# Patient Record
Sex: Female | Born: 1980 | Race: Black or African American | Hispanic: No | Marital: Single
Health system: Midwestern US, Community
[De-identification: ages and names within clinical notes are randomized; demographics above are authoritative.]

## PROBLEM LIST (undated history)

## (undated) DIAGNOSIS — J45909 Unspecified asthma, uncomplicated: Secondary | ICD-10-CM

## (undated) DIAGNOSIS — J45901 Unspecified asthma with (acute) exacerbation: Principal | ICD-10-CM

---

## 1998-03-11 ENCOUNTER — Emergency Department (HOSPITAL_COMMUNITY): Admission: EM | Admit: 1998-03-11 | Discharge: 1998-03-11 | Payer: Self-pay | Admitting: Emergency Medicine

## 1998-10-31 ENCOUNTER — Emergency Department (HOSPITAL_COMMUNITY): Admission: EM | Admit: 1998-10-31 | Discharge: 1998-10-31 | Payer: Self-pay | Admitting: Emergency Medicine

## 1998-11-01 ENCOUNTER — Encounter: Payer: Self-pay | Admitting: Emergency Medicine

## 1999-07-21 ENCOUNTER — Encounter: Payer: Self-pay | Admitting: Emergency Medicine

## 1999-07-21 ENCOUNTER — Emergency Department (HOSPITAL_COMMUNITY): Admission: EM | Admit: 1999-07-21 | Discharge: 1999-07-21 | Payer: Self-pay | Admitting: Emergency Medicine

## 1999-07-31 ENCOUNTER — Inpatient Hospital Stay (HOSPITAL_COMMUNITY): Admission: AD | Admit: 1999-07-31 | Discharge: 1999-07-31 | Payer: Self-pay | Admitting: *Deleted

## 1999-08-12 ENCOUNTER — Encounter: Admission: RE | Admit: 1999-08-12 | Discharge: 1999-08-12 | Payer: Self-pay | Admitting: Internal Medicine

## 2000-09-22 ENCOUNTER — Inpatient Hospital Stay (HOSPITAL_COMMUNITY): Admission: AD | Admit: 2000-09-22 | Discharge: 2000-09-22 | Payer: Self-pay | Admitting: *Deleted

## 2001-03-03 ENCOUNTER — Emergency Department (HOSPITAL_COMMUNITY): Admission: EM | Admit: 2001-03-03 | Discharge: 2001-03-03 | Payer: Self-pay | Admitting: Emergency Medicine

## 2001-05-28 ENCOUNTER — Emergency Department (HOSPITAL_COMMUNITY): Admission: EM | Admit: 2001-05-28 | Discharge: 2001-05-28 | Payer: Self-pay | Admitting: Emergency Medicine

## 2001-05-28 ENCOUNTER — Encounter: Payer: Self-pay | Admitting: Emergency Medicine

## 2002-02-12 ENCOUNTER — Emergency Department (HOSPITAL_COMMUNITY): Admission: EM | Admit: 2002-02-12 | Discharge: 2002-02-12 | Payer: Self-pay | Admitting: Emergency Medicine

## 2002-05-01 ENCOUNTER — Emergency Department (HOSPITAL_COMMUNITY): Admission: EM | Admit: 2002-05-01 | Discharge: 2002-05-01 | Payer: Self-pay

## 2002-07-03 ENCOUNTER — Emergency Department (HOSPITAL_COMMUNITY): Admission: EM | Admit: 2002-07-03 | Discharge: 2002-07-03 | Payer: Self-pay | Admitting: Emergency Medicine

## 2002-07-03 ENCOUNTER — Encounter: Payer: Self-pay | Admitting: *Deleted

## 2002-12-05 ENCOUNTER — Emergency Department (HOSPITAL_COMMUNITY): Admission: EM | Admit: 2002-12-05 | Discharge: 2002-12-06 | Payer: Self-pay | Admitting: Emergency Medicine

## 2003-03-20 ENCOUNTER — Emergency Department (HOSPITAL_COMMUNITY): Admission: EM | Admit: 2003-03-20 | Discharge: 2003-03-20 | Payer: Self-pay

## 2003-04-11 ENCOUNTER — Encounter: Admission: RE | Admit: 2003-04-11 | Discharge: 2003-04-11 | Payer: Self-pay | Admitting: Obstetrics and Gynecology

## 2003-04-29 ENCOUNTER — Inpatient Hospital Stay (HOSPITAL_COMMUNITY): Admission: AD | Admit: 2003-04-29 | Discharge: 2003-04-29 | Payer: Self-pay | Admitting: Family Medicine

## 2003-06-26 ENCOUNTER — Inpatient Hospital Stay (HOSPITAL_COMMUNITY): Admission: AD | Admit: 2003-06-26 | Discharge: 2003-06-26 | Payer: Self-pay | Admitting: Obstetrics and Gynecology

## 2003-08-04 ENCOUNTER — Emergency Department (HOSPITAL_COMMUNITY): Admission: EM | Admit: 2003-08-04 | Discharge: 2003-08-04 | Payer: Self-pay | Admitting: Emergency Medicine

## 2003-10-26 ENCOUNTER — Inpatient Hospital Stay (HOSPITAL_COMMUNITY): Admission: AD | Admit: 2003-10-26 | Discharge: 2003-10-26 | Payer: Self-pay | Admitting: Obstetrics and Gynecology

## 2004-02-22 ENCOUNTER — Emergency Department (HOSPITAL_COMMUNITY): Admission: EM | Admit: 2004-02-22 | Discharge: 2004-02-22 | Payer: Self-pay | Admitting: Emergency Medicine

## 2004-10-28 ENCOUNTER — Emergency Department (HOSPITAL_COMMUNITY): Admission: EM | Admit: 2004-10-28 | Discharge: 2004-10-28 | Payer: Self-pay | Admitting: Emergency Medicine

## 2006-01-01 ENCOUNTER — Emergency Department (HOSPITAL_COMMUNITY): Admission: EM | Admit: 2006-01-01 | Discharge: 2006-01-01 | Payer: Self-pay | Admitting: Emergency Medicine

## 2007-02-01 ENCOUNTER — Emergency Department (HOSPITAL_COMMUNITY): Admission: EM | Admit: 2007-02-01 | Discharge: 2007-02-01 | Payer: Self-pay | Admitting: Family Medicine

## 2008-12-07 ENCOUNTER — Emergency Department (HOSPITAL_COMMUNITY): Admission: EM | Admit: 2008-12-07 | Discharge: 2008-12-08 | Payer: Self-pay | Admitting: Emergency Medicine

## 2008-12-07 ENCOUNTER — Emergency Department (HOSPITAL_COMMUNITY): Admission: EM | Admit: 2008-12-07 | Discharge: 2008-12-07 | Payer: Self-pay | Admitting: Family Medicine

## 2008-12-08 ENCOUNTER — Encounter (INDEPENDENT_AMBULATORY_CARE_PROVIDER_SITE_OTHER): Payer: Self-pay | Admitting: Emergency Medicine

## 2008-12-08 ENCOUNTER — Ambulatory Visit (HOSPITAL_COMMUNITY): Admission: RE | Admit: 2008-12-08 | Discharge: 2008-12-08 | Payer: Self-pay | Admitting: Emergency Medicine

## 2008-12-08 ENCOUNTER — Ambulatory Visit: Payer: Self-pay | Admitting: *Deleted

## 2009-03-25 ENCOUNTER — Emergency Department (HOSPITAL_COMMUNITY): Admission: EM | Admit: 2009-03-25 | Discharge: 2009-03-25 | Payer: Self-pay | Admitting: Emergency Medicine

## 2009-08-20 ENCOUNTER — Emergency Department (HOSPITAL_COMMUNITY): Admission: EM | Admit: 2009-08-20 | Discharge: 2009-08-20 | Payer: Self-pay | Admitting: Emergency Medicine

## 2009-08-21 ENCOUNTER — Emergency Department (HOSPITAL_COMMUNITY): Admission: EM | Admit: 2009-08-21 | Discharge: 2009-08-21 | Payer: Self-pay | Admitting: Emergency Medicine

## 2010-10-22 LAB — POCT URINALYSIS DIP (DEVICE)
Bilirubin Urine: NEGATIVE
Nitrite: NEGATIVE
Protein, ur: 100 mg/dL — AB
Urobilinogen, UA: 2 mg/dL — ABNORMAL HIGH (ref 0.0–1.0)

## 2010-10-22 LAB — BASIC METABOLIC PANEL
BUN: 9 mg/dL (ref 6–23)
Calcium: 9 mg/dL (ref 8.4–10.5)
GFR calc non Af Amer: >60 mL/min (ref >60)

## 2010-10-22 LAB — CBC
Hemoglobin: 12.9 g/dL (ref 12.0–15.0)
RBC: 4.23 MIL/uL (ref 3.87–5.11)

## 2010-10-22 LAB — DIFFERENTIAL
Basophils Absolute: 0 10*3/uL (ref 0.0–0.1)
Eosinophils Absolute: 0.4 10*3/uL (ref 0.0–0.7)
Eosinophils Relative: 8 % — ABNORMAL HIGH (ref 0–5)
Lymphocytes Relative: 34 % (ref 12–46)
Lymphs Abs: 1.6 10*3/uL (ref 0.7–4.0)
Monocytes Absolute: 0.6 10*3/uL (ref 0.1–1.0)

## 2010-10-22 LAB — PROTIME-INR: INR: 1 (ref 0.00–1.49)

## 2010-10-22 LAB — URINE CULTURE: Colony Count: 50000

## 2010-10-22 LAB — APTT: aPTT: 33 seconds (ref 24–37)

## 2011-04-28 LAB — POCT URINALYSIS DIP (DEVICE)
Bilirubin Urine: NEGATIVE
Bilirubin Urine: NEGATIVE
Glucose, UA: NEGATIVE
Glucose, UA: NEGATIVE
Hgb urine dipstick: NEGATIVE
Hgb urine dipstick: NEGATIVE
Ketones, ur: NEGATIVE
Ketones, ur: NEGATIVE
Leukocytes, UA: NEGATIVE
Nitrite: NEGATIVE
Nitrite: NEGATIVE
Operator id: 116391
Protein, ur: NEGATIVE
Protein, ur: NEGATIVE
Specific Gravity, Urine: 1.02
Specific Gravity, Urine: 1.02
Urobilinogen, UA: 4 — ABNORMAL HIGH
Urobilinogen, UA: 4 — ABNORMAL HIGH
pH: 6
pH: 6

## 2011-04-28 LAB — POCT PREGNANCY, URINE
Operator id: 235561
Preg Test, Ur: NEGATIVE
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
## 2011-12-11 LAB — URINE MICROSCOPIC
Casts: 0 /LPF
Crystals, urine: 0 /LPF
Mucus: 0 /LPF
RBC: 0 /HPF

## 2011-12-11 LAB — HCG URINE, QL. - POC: Pregnancy test,urine (POC): NEGATIVE

## 2011-12-11 MED ORDER — HYDROCODONE-ACETAMINOPHEN 7.5 MG-325 MG TAB
ORAL | Status: AC
Start: 2011-12-11 — End: 2011-12-11
  Administered 2011-12-11: 04:00:00 via ORAL

## 2011-12-11 MED ORDER — CYCLOBENZAPRINE 10 MG TAB
10 mg | ORAL_TABLET | Freq: Three times a day (TID) | ORAL | Status: DC | PRN
Start: 2011-12-11 — End: 2018-01-04

## 2011-12-11 MED ORDER — IBUPROFEN 600 MG TAB
600 mg | ORAL_TABLET | Freq: Four times a day (QID) | ORAL | Status: DC | PRN
Start: 2011-12-11 — End: 2018-01-04

## 2011-12-11 MED ORDER — KETOROLAC TROMETHAMINE 30 MG/ML INJECTION
30 mg/mL (1 mL) | INTRAMUSCULAR | Status: AC
Start: 2011-12-11 — End: 2011-12-11
  Administered 2011-12-11: 04:00:00 via INTRAVENOUS

## 2011-12-11 MED FILL — HYDROCODONE-ACETAMINOPHEN 7.5 MG-325 MG TAB: ORAL | Qty: 1

## 2011-12-11 MED FILL — KETOROLAC TROMETHAMINE 30 MG/ML INJECTION: 30 mg/mL (1 mL) | INTRAMUSCULAR | Qty: 1

## 2011-12-11 NOTE — ED Notes (Signed)
Neurovascularly intact

## 2012-09-29 ENCOUNTER — Emergency Department (HOSPITAL_COMMUNITY)
Admission: EM | Admit: 2012-09-29 | Discharge: 2012-09-29 | Disposition: A | Discharge status: Home / Self Care | Payer: Self-pay | Attending: Emergency Medicine | Admitting: Emergency Medicine

## 2012-09-29 ENCOUNTER — Encounter (HOSPITAL_COMMUNITY): Payer: Self-pay | Admitting: Emergency Medicine

## 2012-09-29 ENCOUNTER — Emergency Department (HOSPITAL_COMMUNITY): Payer: Self-pay

## 2012-09-29 DIAGNOSIS — R112 Nausea with vomiting, unspecified: Secondary | ICD-10-CM | POA: Insufficient documentation

## 2012-09-29 DIAGNOSIS — R109 Unspecified abdominal pain: Secondary | ICD-10-CM

## 2012-09-29 DIAGNOSIS — IMO0002 Reserved for concepts with insufficient information to code with codable children: Secondary | ICD-10-CM | POA: Insufficient documentation

## 2012-09-29 DIAGNOSIS — F17200 Nicotine dependence, unspecified, uncomplicated: Secondary | ICD-10-CM | POA: Insufficient documentation

## 2012-09-29 DIAGNOSIS — Z3202 Encounter for pregnancy test, result negative: Secondary | ICD-10-CM | POA: Insufficient documentation

## 2012-09-29 DIAGNOSIS — R1011 Right upper quadrant pain: Secondary | ICD-10-CM | POA: Insufficient documentation

## 2012-09-29 DIAGNOSIS — J45909 Unspecified asthma, uncomplicated: Secondary | ICD-10-CM | POA: Insufficient documentation

## 2012-09-29 HISTORY — DX: Unspecified asthma, uncomplicated: J45.909

## 2012-09-29 LAB — COMPREHENSIVE METABOLIC PANEL
ALT: 8 U/L (ref 0–35)
AST: 13 U/L (ref 0–37)
Albumin: 4.3 g/dL (ref 3.5–5.2)
Alkaline Phosphatase: 52 U/L (ref 39–117)
CO2: 24 mEq/L (ref 19–32)
Calcium: 9.3 mg/dL (ref 8.4–10.5)
Sodium: 138 mEq/L (ref 135–145)
Total Bilirubin: 0.9 mg/dL (ref 0.3–1.2)
Total Protein: 7.8 g/dL (ref 6.0–8.3)

## 2012-09-29 LAB — CBC WITH DIFFERENTIAL/PLATELET
Basophils Absolute: 0 10*3/uL (ref 0.0–0.1)
Hemoglobin: 14.8 g/dL (ref 12.0–15.0)
MCHC: 36.4 g/dL — ABNORMAL HIGH (ref 30.0–36.0)
Monocytes Relative: 6 % (ref 3–12)
Neutro Abs: 4.3 10*3/uL (ref 1.7–7.7)
Neutrophils Relative %: 80 % — ABNORMAL HIGH (ref 43–77)
Platelets: 182 10*3/uL (ref 150–400)

## 2012-09-29 LAB — URINALYSIS, ROUTINE W REFLEX MICROSCOPIC
Ketones, ur: 15 mg/dL — AB
Nitrite: NEGATIVE
Protein, ur: NEGATIVE mg/dL
Specific Gravity, Urine: 1.02 (ref 1.005–1.030)
Urobilinogen, UA: 1 mg/dL (ref 0.0–1.0)
pH: 5.5 (ref 5.0–8.0)

## 2012-09-29 LAB — POCT I-STAT, CHEM 8
Calcium, Ion: 1.12 mmol/L (ref 1.12–1.23)
Glucose, Bld: 108 mg/dL — ABNORMAL HIGH (ref 70–99)
HCT: 46 % (ref 36.0–46.0)
Hemoglobin: 15.6 g/dL — ABNORMAL HIGH (ref 12.0–15.0)
Potassium: 3.6 mEq/L (ref 3.5–5.1)
TCO2: 25 mmol/L (ref 0–100)

## 2012-09-29 LAB — URINE MICROSCOPIC-ADD ON

## 2012-09-29 LAB — LIPASE, BLOOD: Lipase: 17 U/L (ref 11–59)

## 2012-09-29 MED ORDER — PROMETHAZINE HCL 25 MG PO TABS: 25.0000 mg | ORAL_TABLET | Freq: Four times a day (QID) | ORAL | Status: DC | PRN

## 2012-09-29 MED ORDER — HYDROCODONE-ACETAMINOPHEN 5-325 MG PO TABS: 2.0000 | ORAL_TABLET | Freq: Four times a day (QID) | ORAL | Status: DC | PRN

## 2012-09-29 MED ORDER — ONDANSETRON HCL 4 MG/2ML IJ SOLN
4.0000 mg | Freq: Once | INTRAMUSCULAR | Status: AC
  Administered 2012-09-29: 4 mg via INTRAVENOUS
  Filled 2012-09-29: qty 2

## 2012-09-29 MED ORDER — MORPHINE SULFATE 4 MG/ML IJ SOLN
4.0000 mg | Freq: Once | INTRAMUSCULAR | Status: AC
  Administered 2012-09-29: 4 mg via INTRAVENOUS
  Filled 2012-09-29: qty 1

## 2012-09-29 NOTE — ED Provider Notes (Signed)
History     CSN: [id number]  Arrival date & time 09/29/12  0904   First MD Initiated Contact with Patient 09/29/12 [phone]      Chief Complaint  Patient presents with  . Abdominal Pain    (Consider location/radiation/quality/duration/timing/severity/associated sxs/prior treatment) HPI Comments: Patient presents today with a chief complaint of RUQ abdominal pain.  Pain has been present since yesterday afternoon and has been constant since that time.  Pain does not radiate.  Pain associated with nausea and several episodes of vomiting.  No diarrhea.  No fever or chills.  No CP or SOB.  She denies blood in her emesis or blood in her stool.  She has not taken anything for her symptoms prior to arrival.  She reports that she has not had symptoms like this before.  No sick contacts.  She denies prior history of Gallstones.  No prior abdominal surgeries.    The history is provided by the patient.    Past Medical History  Diagnosis Date  . Asthma     History reviewed. No pertinent past surgical history.  History reviewed. No pertinent family history.  History  Substance Use Topics  . Smoking status: Current Every Day Smoker    Types: Cigarettes  . Smokeless tobacco: Not on file  . Alcohol Use: No    OB History   Grav Para Term Preterm Abortions TAB SAB Ect Mult Living                  Review of Systems  Constitutional: Negative for fever and chills.  Respiratory: Negative for shortness of breath.   Cardiovascular: Negative for chest pain.  Gastrointestinal: Positive for nausea, vomiting and abdominal pain. Negative for diarrhea, constipation, blood in stool and abdominal distention.  All other systems reviewed and are negative.    Allergies  Review of patient's allergies indicates no known allergies.  Home Medications   Current Outpatient Rx  Name  Route  Sig  Dispense  Refill  . Fluticasone-Salmeterol (ADVAIR) 250-50 MCG/DOSE AEPB   Inhalation   Inhale 1 puff into  the lungs every 12 (twelve) hours.         . Probiotic Product (PROBIOTIC PO)   Oral   Take 1 capsule by mouth daily.           BP 128/64  Pulse 76  Temp(Src) 98.4 F (36.9 C) (Oral)  Resp 14  Ht 5\' 2"  (1.575 m)  Wt 175 lb (79.379 kg)  BMI 32 kg/m2  SpO2 100%  LMP 09/10/2012  Physical Exam  Nursing note and vitals reviewed. Constitutional: She appears well-developed and well-nourished. No distress.  HENT:  Head: Normocephalic and atraumatic.  Mouth/Throat: Oropharynx is clear and moist.  Neck: Normal range of motion. Neck supple.  Cardiovascular: Normal rate, regular rhythm and normal heart sounds.   Pulmonary/Chest: Effort normal and breath sounds normal.  Abdominal: Soft. Normal appearance and bowel sounds are normal. She exhibits no distension and no mass. There is tenderness in the right upper quadrant. There is positive Murphy's sign. There is no rigidity, no rebound, no guarding and no tenderness at McBurney's point.  Neurological: She is alert.  Skin: Skin is warm and dry. She is not diaphoretic.  Psychiatric: She has a normal mood and affect.    ED Course  Procedures (including critical care time)  Labs Reviewed  CBC WITH DIFFERENTIAL - Abnormal; Notable for the following:    MCHC 36.4 (*)    Neutrophils Relative 80 (*)  Lymphs Abs 0.6 (*)    All other components within normal limits  URINALYSIS, ROUTINE W REFLEX MICROSCOPIC - Abnormal; Notable for the following:    APPearance CLOUDY (*)    Bilirubin Urine SMALL (*)    Ketones, ur 15 (*)    Leukocytes, UA SMALL (*)    All other components within normal limits  COMPREHENSIVE METABOLIC PANEL - Abnormal; Notable for the following:    Glucose, Bld 108 (*)    All other components within normal limits  URINE MICROSCOPIC-ADD ON - Abnormal; Notable for the following:    Squamous Epithelial / LPF FEW (*)    Bacteria, UA FEW (*)    All other components within normal limits  POCT I-STAT, CHEM 8 - Abnormal;  Notable for the following:    Glucose, Bld 108 (*)    Hemoglobin 15.6 (*)    All other components within normal limits  URINE CULTURE  LIPASE, BLOOD  POCT PREGNANCY, URINE   US Abdomen Complete  09/29/2012  *RADIOLOGY REPORT*  Clinical Data:  Right upper quadrant pain.  COMPLETE ABDOMINAL ULTRASOUND  Comparison:  None.  Findings:  Gallbladder:  No gallstones, gallbladder wall thickening, or pericholecystic fluid.  Common bile duct:  4.4 mm.  Liver:  No focal lesion identified.  Within normal limits in parenchymal echogenicity.  IVC:  Appears normal.  Pancreas:  Evaluation limited by bowel gas.  No gross abnormality noted.  Spleen:  4.1 cm. No hydronephrosis or renal mass.  Right Kidney:  10.9 cm. No hydronephrosis or renal mass.  Left Kidney:  10.5 cm. No hydronephrosis or renal mass.  Abdominal aorta:  No aneurysm identified.  IMPRESSION: Evaluation of pancreas limited by bowel gas.  Remainder of exam unremarkable.   Original Report Authenticated By: Lacy Duverney, M.D.      No diagnosis found.  Reassessed patient.  Patient reports that her pain and nausea have improved at this time.  Will fluid challenge and reassess.  Patient able to tolerate PO liquids.  MDM  Patient presenting with RUQ abdominal pain, nausea, and vomiting.  Labs unremarkable.  Abdominal ultrasound negative.  Patient able to tolerate PO liquids.  Pain controlled prior to discharge.  Patient given GI follow up if symptoms continue.        Pascal Lux Alpha, PA-C 09/29/12 1901

## 2012-09-29 NOTE — ED Provider Notes (Signed)
Medical screening examination/treatment/procedure(s) were performed by non-physician practitioner and as supervising physician I was immediately available for consultation/collaboration.   Shelda Jakes, MD 09/29/12 2043

## 2012-09-29 NOTE — ED Notes (Signed)
Pt reports sudden onset of nausea/vomiting and upper abdominal pain yesterday at approx 2pm. States no sick contacts. No prior abdominal surgeries. 10/10 pain tearful.

## 2012-09-29 NOTE — ED Notes (Signed)
Patient transported to X-ray 

## 2012-09-30 LAB — URINE CULTURE
Colony Count: NO GROWTH
Culture: NO GROWTH

## 2013-07-23 ENCOUNTER — Encounter (HOSPITAL_COMMUNITY): Payer: Self-pay | Admitting: Emergency Medicine

## 2013-07-23 ENCOUNTER — Emergency Department (HOSPITAL_COMMUNITY)
Admission: EM | Admit: 2013-07-23 | Discharge: 2013-07-23 | Disposition: A | Discharge status: Home / Self Care | Payer: Self-pay | Attending: Emergency Medicine | Admitting: Emergency Medicine

## 2013-07-23 DIAGNOSIS — H6690 Otitis media, unspecified, unspecified ear: Secondary | ICD-10-CM | POA: Insufficient documentation

## 2013-07-23 DIAGNOSIS — F17200 Nicotine dependence, unspecified, uncomplicated: Secondary | ICD-10-CM | POA: Insufficient documentation

## 2013-07-23 DIAGNOSIS — H60399 Other infective otitis externa, unspecified ear: Secondary | ICD-10-CM | POA: Insufficient documentation

## 2013-07-23 DIAGNOSIS — H9190 Unspecified hearing loss, unspecified ear: Secondary | ICD-10-CM | POA: Insufficient documentation

## 2013-07-23 DIAGNOSIS — H6092 Unspecified otitis externa, left ear: Secondary | ICD-10-CM

## 2013-07-23 DIAGNOSIS — Z79899 Other long term (current) drug therapy: Secondary | ICD-10-CM | POA: Insufficient documentation

## 2013-07-23 DIAGNOSIS — H6692 Otitis media, unspecified, left ear: Secondary | ICD-10-CM

## 2013-07-23 DIAGNOSIS — J45909 Unspecified asthma, uncomplicated: Secondary | ICD-10-CM | POA: Insufficient documentation

## 2013-07-23 MED ORDER — ANTIPYRINE-BENZOCAINE 5.4-1.4 % OT SOLN
3.0000 [drp] | Freq: Once | OTIC | Status: AC
  Administered 2013-07-23: 4 [drp] via OTIC
  Filled 2013-07-23: qty 10

## 2013-07-23 MED ORDER — AMOXICILLIN 500 MG PO CAPS: 500.0000 mg | ORAL_CAPSULE | Freq: Three times a day (TID) | ORAL | Status: DC

## 2013-07-23 MED ORDER — IBUPROFEN 400 MG PO TABS
800.0000 mg | ORAL_TABLET | Freq: Once | ORAL | Status: AC
  Administered 2013-07-23: 800 mg via ORAL
  Filled 2013-07-23: qty 2

## 2013-07-23 MED ORDER — CIPROFLOXACIN-HYDROCORTISONE 0.2-1 % OT SUSP: 3.0000 [drp] | Freq: Two times a day (BID) | OTIC | Status: DC

## 2013-07-23 NOTE — ED Provider Notes (Signed)
Medical screening examination/treatment/procedure(s) were performed by non-physician practitioner and as supervising physician I was immediately available for consultation/collaboration.     Geoffery Lyonsouglas Lucy Boardman, MD 07/23/13 [phone]

## 2013-07-23 NOTE — Discharge Instructions (Signed)
Read the information below.  Use the prescribed medication as directed.  Please discuss all new medications with your pharmacist.  You may return to the Emergency Department at any time for worsening condition or any new symptoms that concern you.  Please followup with your primary care provider if your symptoms are not improving. Return to the emergency department immediately for uncontrolled pain, bleeding from your ear, fever, chills, uncontrolled vomiting.     Otitis Externa Otitis externa is a bacterial or fungal infection of the outer ear canal. This is the area from the eardrum to the outside of the ear. Otitis externa is sometimes called "swimmer's ear." CAUSES  Possible causes of infection include:  Swimming in dirty water.  Moisture remaining in the ear after swimming or bathing.  Mild injury (trauma) to the ear.  Objects stuck in the ear (foreign body).  Cuts or scrapes (abrasions) on the outside of the ear. SYMPTOMS  The first symptom of infection is often itching in the ear canal. Later signs and symptoms may include swelling and redness of the ear canal, ear pain, and yellowish-white fluid (pus) coming from the ear. The ear pain may be worse when pulling on the earlobe. DIAGNOSIS  Your caregiver will perform a physical exam. A sample of fluid may be taken from the ear and examined for bacteria or fungi. TREATMENT  Antibiotic ear drops are often given for 10 to 14 days. Treatment may also include pain medicine or corticosteroids to reduce itching and swelling. PREVENTION   Keep your ear dry. Use the corner of a towel to absorb water out of the ear canal after swimming or bathing.  Avoid scratching or putting objects inside your ear. This can damage the ear canal or remove the protective wax that lines the canal. This makes it easier for bacteria and fungi to grow.  Avoid swimming in lakes, polluted water, or poorly chlorinated pools.  You may use ear drops made of rubbing  alcohol and vinegar after swimming. Combine equal parts of white vinegar and alcohol in a bottle. Put 3 or 4 drops into each ear after swimming. HOME CARE INSTRUCTIONS   Apply antibiotic ear drops to the ear canal as prescribed by your caregiver.  Only take over-the-counter or prescription medicines for pain, discomfort, or fever as directed by your caregiver.  If you have diabetes, follow any additional treatment instructions from your caregiver.  Keep all follow-up appointments as directed by your caregiver. SEEK MEDICAL CARE IF:   You have a fever.  Your ear is still red, swollen, painful, or draining pus after 3 days.  Your redness, swelling, or pain gets worse.  You have a severe headache.  You have redness, swelling, pain, or tenderness in the area behind your ear. MAKE SURE YOU:   Understand these instructions.  Will watch your condition.  Will get help right away if you are not doing well or get worse. Document Released: 06/30/2005 Document Revised: 09/22/2011 Document Reviewed: 07/17/2011 [facility] Patient Information 2014 [place], [place].  Otitis Media, Adult Otitis media is redness, soreness, and swelling (inflammation) of the middle ear. Otitis media may be caused by allergies or, most commonly, by infection. Often it occurs as a complication of the common cold. SIGNS AND SYMPTOMS Symptoms of otitis media may include:  Earache.  Fever.  Ringing in your ear.  Headache.  Leakage of fluid from the ear. DIAGNOSIS To diagnose otitis media, your health care provider will examine your ear with an otoscope. This is  an instrument that allows your health care provider to see into your ear in order to examine your eardrum. Your health care provider also will ask you questions about your symptoms. TREATMENT  Typically, otitis media resolves on its own within 3 5 days. Your health care provider may prescribe medicine to ease your symptoms of pain. If otitis media does  not resolve within 5 days or is recurrent, your health care provider may prescribe antibiotic medicines if he or she suspects that a bacterial infection is the cause. HOME CARE INSTRUCTIONS   Take your medicine as directed until it is gone, even if you feel better after the first few days.  Only take over-the-counter or prescription medicines for pain, discomfort, or fever as directed by your health care provider.  Follow up with your health care provider as directed. SEEK MEDICAL CARE IF:  You have otitis media only in one ear or bleeding from your nose or both.  You notice a lump on your neck.  You are not getting better in 3 5 days.  You feel worse instead of better. SEEK IMMEDIATE MEDICAL CARE IF:   You have pain that is not controlled with medicine.  You have swelling, redness, or pain around your ear or stiffness in your neck.  You notice that part of your face is paralyzed.  You notice that the bone behind your ear (mastoid) is tender when you touch it. MAKE SURE YOU:   Understand these instructions.  Will watch your condition.  Will get help right away if you are not doing well or get worse. Document Released: 04/04/2004 Document Revised: 04/20/2013 Document Reviewed: 01/25/2013 [facility] Patient Information 2014 [place].  Ear Drops, Adult You have been diagnosed with a condition requiring you to put drops of medication into your outer ear. HOME CARE INSTRUCTIONS   Put drops in the affected ear as instructed. After putting the drops in, you will need to lay down with the affected ear facing up for ten minutes so the drops will remain in the ear canal and run down and fill the canal. Continue using eardrops for as long as directed by your health care provider.  Prior to getting up, put a cotton ball gently in your ear canal. Leave enough of the ball out so it can be easily removed. Do not attempt to push this down into the canal with a cotton-tipped swab or  other instrument.  Do not irrigate or wash out your ears if you have had a perforated eardrum or mastoid surgery, or unless instructed to do so by your health care provider.  Keep appointments with your health care provider as instructed.  Finish all medications, or use for the length of time as instructed. Continue the drops even if your problem seems to be doing well after a couple days, or continue as instructed. SEEK MEDICAL CARE IF:  You become worse or develop increasing pain.  You notice any unusual drainage from your ear (particularly if the drainage stinks).  You develop hearing difficulties.  You experience a serious form of dizziness in which you feel as if the room is spinning, and you feel nauseated (vertigo).  The outside of your ear becomes red or swollen or both. This may be a sign of an allergic reaction. MAKE SURE YOU:   Understand these instructions.  Will watch your condition.  Will get help right away if you are not doing well or get worse. Document Released: 06/24/2001 Document Revised: 04/20/2013 Document Reviewed:  01/25/2013 [employer] Patient Information 2014 [place], [place].

## 2013-07-23 NOTE — ED Provider Notes (Signed)
CSN: [id number]     Arrival date & time 07/23/13  0901 History  This chart was scribed for non-physician practitioner, Trixie DredgeEmily Rebacca Votaw, PA-C, working with Geoffery Lyonsouglas Delo, MD by Shari HeritageAisha Amuda, ED Scribe. This patient was seen in room TR07C/TR07C and the patient's care was started at 9:23 AM.      Chief Complaint  Patient presents with  . Otalgia    The history is provided by the patient. No language interpreter was used.    HPI Comments: Valerie Costa is a 33 y.o. female with history of asthma who presents to the Emergency Department complaining of left ear pain onset 4 days ago. She describes pain as sharp and stabbing followed by pressure. She rates pain as 10/10.  Pain is worse with air exposure to the ear. She reports associated ear drainage that is clear tinged with red (believed to be blood) and decreased hearing from the left ear. Patient states that she tried to clean her ear out with peroxide which did not improve symptoms. She also used a Q-tip with no relief. She has not been taking any pain medicines. She denies any recent injury or trauma to her ear. She has not experienced similar symptoms in the recent past. She denies nausea, cough, sinus pressure, chills, body aches, nasal congestion, vomiting, diarrhea, sore throat and fever. She has no history of DM.   Past Medical History  Diagnosis Date  . Asthma    History reviewed. No pertinent past surgical history. No family history on file. History  Substance Use Topics  . Smoking status: Current Every Day Smoker    Types: Cigarettes  . Smokeless tobacco: Not on file  . Alcohol Use: No   OB History   Grav Para Term Preterm Abortions TAB SAB Ect Mult Living                 Review of Systems  Constitutional: Negative for fever and chills.  HENT: Positive for ear pain and hearing loss. Negative for congestion, sinus pressure and sore throat.   Gastrointestinal: Negative for nausea, vomiting and diarrhea.  Musculoskeletal:  Negative for myalgias.    Allergies  Review of patient's allergies indicates no known allergies.  Home Medications   Current Outpatient Rx  Name  Route  Sig  Dispense  Refill  . Fluticasone-Salmeterol (ADVAIR) 250-50 MCG/DOSE AEPB   Inhalation   Inhale 1 puff into the lungs every 12 (twelve) hours.         Marland Kitchen. HYDROcodone-acetaminophen (NORCO/VICODIN) 5-325 MG per tablet   Oral   Take 2 tablets by mouth every 6 (six) hours as needed for pain.   10 tablet   0   . Probiotic Product (PROBIOTIC PO)   Oral   Take 1 capsule by mouth daily.         . promethazine (PHENERGAN) 25 MG tablet   Oral   Take 1 tablet (25 mg total) by mouth every 6 (six) hours as needed for nausea.   20 tablet   0    Triage Vitals: BP 111/76  Pulse 78  Temp(Src) 98.5 F (36.9 C) (Oral)  Resp 16  Ht 5\' 6"  (1.676 m)  Wt 175 lb (79.379 kg)  BMI 28.26 kg/m2  SpO2 97% Physical Exam  Nursing note and vitals reviewed. Constitutional: She appears well-developed and well-nourished. No distress.  HENT:  Head: Normocephalic and atraumatic.  Right Ear: Tympanic membrane, external ear and ear canal normal.  Nose: Nose normal. Right sinus exhibits no maxillary  sinus tenderness and no frontal sinus tenderness. Left sinus exhibits no maxillary sinus tenderness and no frontal sinus tenderness.  Mouth/Throat: Oropharynx is clear and moist. No oropharyngeal exudate, posterior oropharyngeal edema or posterior oropharyngeal erythema.  Pain with traction of pinna and pressure on tragus. Left TM white and bulging. Left canal edematous and erythematous.  Eyes: Conjunctivae are normal.  Neck: Neck supple.  Pulmonary/Chest: Effort normal.  Lymphadenopathy:       Head (left side): Preauricular and posterior auricular adenopathy present.    She has no cervical adenopathy.  Neurological: She is alert.  Skin: She is not diaphoretic.    ED Course  Procedures (including critical care time) DIAGNOSTIC STUDIES: Oxygen  Saturation is 97% on room air, adequate by my interpretation.    COORDINATION OF CARE: 9:27 AM- Patient's symptoms and exam are consistent with left otitis externa and otitis media. Will give ibuprofen and Auralgan the ED and prescribe amoxicillin and Cipro to treat infection. Patient informed of current plan for treatment and evaluation and agrees with plan at this time.   Filed Vitals:   07/23/13 0915  BP: 111/76  Pulse: 78  Temp: 98.5 F (36.9 C)  Resp: 16     MDM   1. Otitis externa, left   2. Otitis media, left    Afebrile nontoxic patient with left OM and OE.  No URI symptoms.  She is not diabetic.  D/C home with auralgan, amoxicillin, cipro HC otic.  PCP follow up.  Discussed result, findings, treatment, and follow up  with patient.  Pt given return precautions.  Pt verbalizes understanding and agrees with plan.      I personally performed the services described in this documentation, which was scribed in my presence. The recorded information has been reviewed and is accurate.    [place], PA-C 07/23/13 [phone]

## 2013-07-23 NOTE — ED Notes (Signed)
Onset 5-7 days ago pain left ear worsening overtime.

## 2013-07-27 ENCOUNTER — Telehealth (HOSPITAL_COMMUNITY): Payer: Self-pay

## 2013-07-27 NOTE — ED Notes (Signed)
Call requesting clarification of strength of Cipro HC otic susp.Archie Endo.  B. Tran PA consulted strength 0.2% - 1%(only 1 strength).  Information provided.  Called back to verify w/RPh that Rx is Cipro HC otic was changed to Cipro otic.  Informed RPh pt can  call CM for poss help w/cost.

## 2013-12-09 ENCOUNTER — Encounter (HOSPITAL_COMMUNITY): Payer: Self-pay | Admitting: Emergency Medicine

## 2013-12-09 ENCOUNTER — Emergency Department (INDEPENDENT_AMBULATORY_CARE_PROVIDER_SITE_OTHER): Payer: Self-pay

## 2013-12-09 ENCOUNTER — Emergency Department (INDEPENDENT_AMBULATORY_CARE_PROVIDER_SITE_OTHER)
Admission: EM | Admit: 2013-12-09 | Discharge: 2013-12-09 | Disposition: A | Discharge status: Home / Self Care | Payer: Self-pay | Source: Home / Self Care | Attending: Family Medicine | Admitting: Family Medicine

## 2013-12-09 DIAGNOSIS — J302 Other seasonal allergic rhinitis: Secondary | ICD-10-CM

## 2013-12-09 DIAGNOSIS — J309 Allergic rhinitis, unspecified: Secondary | ICD-10-CM

## 2013-12-09 MED ORDER — FLUTICASONE PROPIONATE 50 MCG/ACT NA SUSP: 1.0000 | Freq: Two times a day (BID) | NASAL | Status: DC

## 2013-12-09 MED ORDER — TRIAMCINOLONE ACETONIDE 40 MG/ML IJ SUSP
40.0000 mg | Freq: Once | INTRAMUSCULAR | Status: AC
  Administered 2013-12-09: 40 mg via INTRAMUSCULAR

## 2013-12-09 MED ORDER — TRIAMCINOLONE ACETONIDE 40 MG/ML IJ SUSP
INTRAMUSCULAR | Status: AC
  Filled 2013-12-09: qty 1

## 2013-12-09 MED ORDER — METHYLPREDNISOLONE ACETATE 40 MG/ML IJ SUSP
80.0000 mg | Freq: Once | INTRAMUSCULAR | Status: AC
  Administered 2013-12-09: 80 mg via INTRAMUSCULAR

## 2013-12-09 MED ORDER — METHYLPREDNISOLONE ACETATE 80 MG/ML IJ SUSP
INTRAMUSCULAR | Status: AC
  Filled 2013-12-09: qty 1

## 2013-12-09 NOTE — ED Notes (Signed)
Pt  Reports  Symptoms  Of   A  Headache      Since  5  Days       Pt  Reports        Symptoms  Began  After  Swimming      -  Pain is  Worse  When  She  Looks  Down            She  Has  No history of  Headaches           She  Is  Sitting  Upright on    Exam table  Speaking in  Complete  sentances

## 2013-12-09 NOTE — ED Provider Notes (Addendum)
CSN: [id number]     Arrival date & time 12/09/13  4944 History   First MD Initiated Contact with Patient 12/09/13 [phone]     Chief Complaint  Patient presents with  . Headache   (Consider location/radiation/quality/duration/timing/severity/associated sxs/prior Treatment) Patient is a 33 y.o. female presenting with headaches. The history is provided by the patient.  Headache Pain location:  Frontal Radiates to:  Does not radiate Onset quality:  Gradual Duration:  5 days Timing:  Constant Progression:  Unchanged Chronicity:  New Similar to prior headaches: no   Context: activity   Context: not exposure to bright light, not coughing, not stress and not exposure to cold air   Relieved by:  None tried Worsened by:  Nothing tried Ineffective treatments:  None tried Associated symptoms: dizziness, drainage and nausea   Associated symptoms: no blurred vision, no fever, no visual change and no vomiting     Past Medical History  Diagnosis Date  . Asthma    History reviewed. No pertinent past surgical history. History reviewed. No pertinent family history. History  Substance Use Topics  . Smoking status: Current Every Day Smoker    Types: Cigarettes  . Smokeless tobacco: Not on file  . Alcohol Use: No   OB History   Grav Para Term Preterm Abortions TAB SAB Ect Mult Living                 Review of Systems  Constitutional: Negative.  Negative for fever and chills.  HENT: Positive for postnasal drip.   Eyes: Negative for blurred vision.  Gastrointestinal: Positive for nausea. Negative for vomiting.  Neurological: Positive for dizziness and headaches.    Allergies  Review of patient's allergies indicates no known allergies.  Home Medications   Prior to Admission medications   Medication Sig Start Date End Date Taking? Authorizing Provider  albuterol (PROVENTIL HFA;VENTOLIN HFA) 108 (90 BASE) MCG/ACT inhaler Inhale 2 puffs into the lungs every 6 (six) hours as needed for  wheezing or shortness of breath.    Historical Provider, MD  amoxicillin (AMOXIL) 500 MG capsule Take 1 capsule (500 mg total) by mouth 3 (three) times daily. 07/23/13   Trixie Dredge, PA-C  ciprofloxacin-hydrocortisone (CIPRO [facility]) otic suspension Place 3 drops into the left ear 2 (two) times daily. X 7 days 07/23/13   Trixie Dredge, PA-C  fluticasone [facility]) 50 MCG/ACT nasal spray Place 1 spray into both nostrils 2 (two) times daily. 12/09/13   Linna Hoff, MD   BP 128/70  Pulse 74  Temp(Src) 98.6 F (37 C)  Resp 18  SpO2 100%  LMP 12/01/2013 Physical Exam  Nursing note and vitals reviewed. Constitutional: She is oriented to person, place, and time. She appears well-developed and well-nourished.  HENT:  Head: Normocephalic.  Right Ear: External ear normal.  Left Ear: External ear normal.  Mouth/Throat: Oropharynx is clear and moist.  Eyes: Conjunctivae and EOM are normal. Pupils are equal, round, and reactive to light.  Neck: Normal range of motion. Neck supple.  Cardiovascular: Normal heart sounds.   Pulmonary/Chest: Breath sounds normal.  Musculoskeletal: She exhibits no tenderness.  Lymphadenopathy:    She has no cervical adenopathy.  Neurological: She is alert and oriented to person, place, and time. No cranial nerve deficit. Coordination normal.  Skin: Skin is warm and dry.    ED Course  Procedures (including critical care time) Labs Review Labs Reviewed - No data to display  Imaging Review Dg Sinuses Complete  12/09/2013   CLINICAL  DATA:  Headache and sinus congestion  EXAM: PARANASAL SINUSES - COMPLETE 3 + VIEW  COMPARISON:  None.  FINDINGS: Frontal, water's, lateral, and submentovertex images were obtained. Paranasal sinuses are clear. There is no air-fluid level. There is no bony destruction or expansion. Nasal septum is essentially midline.  IMPRESSION: Paranasal sinuses are clear.   Electronically Signed   By: Bretta BangWilliam  Woodruff M.D.   On: 12/09/2013 10:25     MDM    1. Seasonal allergic reaction        Linna HoffJames D Shakur Lembo, MD 12/09/13 1009  Linna HoffJames D Ashland Osmer, MD 12/09/13 1050

## 2013-12-22 ENCOUNTER — Encounter (HOSPITAL_COMMUNITY): Payer: Self-pay | Admitting: Emergency Medicine

## 2013-12-22 ENCOUNTER — Emergency Department (HOSPITAL_COMMUNITY)
Admission: EM | Admit: 2013-12-22 | Discharge: 2013-12-22 | Disposition: A | Discharge status: Home / Self Care | Payer: Self-pay | Attending: Emergency Medicine | Admitting: Emergency Medicine

## 2013-12-22 ENCOUNTER — Emergency Department (HOSPITAL_COMMUNITY): Payer: Self-pay

## 2013-12-22 DIAGNOSIS — Z79899 Other long term (current) drug therapy: Secondary | ICD-10-CM | POA: Insufficient documentation

## 2013-12-22 DIAGNOSIS — Y9389 Activity, other specified: Secondary | ICD-10-CM | POA: Insufficient documentation

## 2013-12-22 DIAGNOSIS — J45909 Unspecified asthma, uncomplicated: Secondary | ICD-10-CM | POA: Insufficient documentation

## 2013-12-22 DIAGNOSIS — Z792 Long term (current) use of antibiotics: Secondary | ICD-10-CM | POA: Insufficient documentation

## 2013-12-22 DIAGNOSIS — Y9289 Other specified places as the place of occurrence of the external cause: Secondary | ICD-10-CM | POA: Insufficient documentation

## 2013-12-22 DIAGNOSIS — M25561 Pain in right knee: Secondary | ICD-10-CM

## 2013-12-22 DIAGNOSIS — S8990XA Unspecified injury of unspecified lower leg, initial encounter: Secondary | ICD-10-CM | POA: Insufficient documentation

## 2013-12-22 DIAGNOSIS — F17200 Nicotine dependence, unspecified, uncomplicated: Secondary | ICD-10-CM | POA: Insufficient documentation

## 2013-12-22 DIAGNOSIS — S99919A Unspecified injury of unspecified ankle, initial encounter: Principal | ICD-10-CM

## 2013-12-22 DIAGNOSIS — IMO0002 Reserved for concepts with insufficient information to code with codable children: Secondary | ICD-10-CM | POA: Insufficient documentation

## 2013-12-22 DIAGNOSIS — S99929A Unspecified injury of unspecified foot, initial encounter: Principal | ICD-10-CM

## 2013-12-22 DIAGNOSIS — Y990 Civilian activity done for income or pay: Secondary | ICD-10-CM | POA: Insufficient documentation

## 2013-12-22 MED ORDER — TRAMADOL HCL 50 MG PO TABS
50.0000 mg | ORAL_TABLET | Freq: Once | ORAL | Status: AC
  Administered 2013-12-22: 50 mg via ORAL
  Filled 2013-12-22: qty 1

## 2013-12-22 MED ORDER — TRAMADOL HCL 50 MG PO TABS: 50.0000 mg | ORAL_TABLET | Freq: Four times a day (QID) | ORAL | Status: DC | PRN

## 2013-12-22 NOTE — ED Provider Notes (Signed)
Medical screening examination/treatment/procedure(s) were performed by non-physician practitioner and as supervising physician I was immediately available for consultation/collaboration.  Sota Hetz M Kenlei Safi, MD 12/22/13 1719 

## 2013-12-22 NOTE — ED Notes (Signed)
Pt states she "banged" right knee on something at work 3 days ago. Has had pain and "popping" since then.

## 2013-12-22 NOTE — ED Provider Notes (Signed)
CSN: [id number]     Arrival date & time 12/22/13  1050 History  This chart was scribed for non-physician practitioner, Emilia Beck, PA-C, working with Lyanne Co, MD by Shari Heritage, ED Scribe. This patient was seen in room TR08C/TR08C and the patient's care was started at 11:22 AM.   Chief Complaint  Patient presents with  . Knee Injury    Patient is a 33 y.o. female presenting with knee pain. The history is provided by the patient. No language interpreter was used.  Knee Pain Location:  Knee Injury: yes   Mechanism of injury comment:  Direct blow Knee location:  R knee Pain details:    Radiates to:  Does not radiate   Severity:  Moderate   Duration:  3 days   Timing:  Constant Chronicity:  New Dislocation: no   Associated symptoms: no fever, no muscle weakness and no numbness     HPI Comments: Valerie Costa is a 33 y.o. female who presents to the Emergency Department complaining of constant, moderate right anterior knee pain for the past 3 days. Pain does not radiate. Patient states that she banged her knee on something at work and has had pain since then. She reports that she has been elevating her knee at home without significant relief. Pain is worse with range of motion of the knee. She denies any falls. There is no associated swelling. There is no numbness or weakness of the extremities, nausea, vomiting, fever or chills. Patient has a medical history of asthma. She currently smokes cigarettes.   Past Medical History  Diagnosis Date  . Asthma    No past surgical history on file. No family history on file. History  Substance Use Topics  . Smoking status: Current Every Day Smoker    Types: Cigarettes  . Smokeless tobacco: Not on file  . Alcohol Use: No   OB History   Grav Para Term Preterm Abortions TAB SAB Ect Mult Living                 Review of Systems  Constitutional: Negative for fever and chills.  Gastrointestinal: Negative for nausea and  vomiting.  Musculoskeletal: Positive for arthralgias (right knee pain).  Neurological: Negative for weakness and numbness.  All other systems reviewed and are negative.   Allergies  Review of patient's allergies indicates no known allergies.  Home Medications   Prior to Admission medications   Medication Sig Start Date End Date Taking? Authorizing Provider  albuterol (PROVENTIL HFA;VENTOLIN HFA) 108 (90 BASE) MCG/ACT inhaler Inhale 2 puffs into the lungs every 6 (six) hours as needed for wheezing or shortness of breath.    Historical Provider, MD  amoxicillin (AMOXIL) 500 MG capsule Take 1 capsule (500 mg total) by mouth 3 (three) times daily. 07/23/13   Trixie Dredge, PA-C  ciprofloxacin-hydrocortisone (CIPRO [facility]) otic suspension Place 3 drops into the left ear 2 (two) times daily. X 7 days 07/23/13   Trixie Dredge, PA-C  fluticasone [facility]) 50 MCG/ACT nasal spray Place 1 spray into both nostrils 2 (two) times daily. 12/09/13   Linna Hoff, MD   Triage Vitals: BP 137/77  Pulse 82  Temp(Src) 98.5 F (36.9 C)  SpO2 100%  LMP 12/01/2013 Physical Exam  Nursing note and vitals reviewed. Constitutional: She is oriented to person, place, and time. She appears well-developed and well-nourished. No distress.  HENT:  Head: Normocephalic and atraumatic.  Eyes: Conjunctivae and EOM are normal.  Neck: Normal range of motion.  No tracheal deviation present.  Cardiovascular: Normal rate.   Pulmonary/Chest: Effort normal. No respiratory distress.  Musculoskeletal: Normal range of motion.       Right knee: She exhibits no swelling and no deformity. Tenderness found. Patellar tendon tenderness noted.  Tenderness to palpation over the right patellar tendon. No obvious deformity or swelling. Slightly limited range of motion due to pain.  Neurological: She is alert and oriented to person, place, and time.  Skin: Skin is warm and dry.  Psychiatric: She has a normal mood and affect. Her behavior is normal.     ED Course  Procedures (including critical care time) DIAGNOSTIC STUDIES: Oxygen Saturation is 100% on room air, normal by my interpretation.    COORDINATION OF CARE: 11:25 AM- Patient informed of current plan for treatment and evaluation and agrees with plan at this time.   Imaging Review Dg Knee Complete 4 Views Right  12/22/2013   CLINICAL DATA:  Trauma 3 days ago with persistent pain and swelling  EXAM: RIGHT KNEE - COMPLETE 4+ VIEW  COMPARISON:  Right knee series dated August 20, 2009  FINDINGS: The bones are adequately mineralized. There is no acute fracture nor dislocation. There is no significant degenerative change. The overlying soft tissues are normal.  IMPRESSION: There is no acute bony abnormality of the right knee.   Electronically Signed   By: David  SwazilandJordan   On: 12/22/2013 11:57     MDM   Final diagnoses:  Right knee pain    12:14 PM Patient's xray unremarkable for acute changes. Patient will have knee sleeve for support. Patient will have Tramadol for pain. Patient advised to rest, ice, and elevate right knee. No neurovascular compromise.   I personally performed the services described in this documentation, which was scribed in my presence. The recorded information has been reviewed and is accurate.    Emilia BeckKaitlyn Adhrit Krenz, PA-C 12/22/13 1215

## 2013-12-22 NOTE — Discharge Instructions (Signed)
Take Tramadol as needed for pain. Rest, ice and elevate your knee. Wear your knee sleeve for support. Follow up with your doctor for further evaluation.

## 2013-12-27 NOTE — Discharge Planning (Signed)
[facility] was not able to see patient, [facility] orange card information and resource guide will be mailed to the address listed. °

## 2014-08-13 ENCOUNTER — Encounter (HOSPITAL_COMMUNITY): Payer: Self-pay | Admitting: Emergency Medicine

## 2014-08-13 ENCOUNTER — Emergency Department (HOSPITAL_COMMUNITY): Payer: Self-pay

## 2014-08-13 ENCOUNTER — Emergency Department (HOSPITAL_COMMUNITY)
Admission: EM | Admit: 2014-08-13 | Discharge: 2014-08-13 | Disposition: A | Discharge status: Home / Self Care | Payer: Self-pay | Attending: Emergency Medicine | Admitting: Emergency Medicine

## 2014-08-13 DIAGNOSIS — Z720 Tobacco use: Secondary | ICD-10-CM | POA: Insufficient documentation

## 2014-08-13 DIAGNOSIS — R109 Unspecified abdominal pain: Secondary | ICD-10-CM

## 2014-08-13 DIAGNOSIS — J45909 Unspecified asthma, uncomplicated: Secondary | ICD-10-CM | POA: Insufficient documentation

## 2014-08-13 DIAGNOSIS — M79645 Pain in left finger(s): Secondary | ICD-10-CM | POA: Insufficient documentation

## 2014-08-13 DIAGNOSIS — R1011 Right upper quadrant pain: Secondary | ICD-10-CM | POA: Insufficient documentation

## 2014-08-13 DIAGNOSIS — Z3202 Encounter for pregnancy test, result negative: Secondary | ICD-10-CM | POA: Insufficient documentation

## 2014-08-13 DIAGNOSIS — Z79899 Other long term (current) drug therapy: Secondary | ICD-10-CM | POA: Insufficient documentation

## 2014-08-13 DIAGNOSIS — R52 Pain, unspecified: Secondary | ICD-10-CM

## 2014-08-13 LAB — CBC WITH DIFFERENTIAL/PLATELET
BASOS ABS: 0 10*3/uL (ref 0.0–0.1)
Basophils Relative: 0 % (ref 0–1)
EOS ABS: 0.2 10*3/uL (ref 0.0–0.7)
EOS PCT: 5 % (ref 0–5)
HEMATOCRIT: 38.2 % (ref 36.0–46.0)
Hemoglobin: 13.3 g/dL (ref 12.0–15.0)
LYMPHS ABS: 1.4 10*3/uL (ref 0.7–4.0)
Lymphocytes Relative: 44 % (ref 12–46)
MCH: 30.8 pg (ref 26.0–34.0)
MCHC: 34.8 g/dL (ref 30.0–36.0)
MCV: 88.4 fL (ref 78.0–100.0)
Monocytes Absolute: 0.3 10*3/uL (ref 0.1–1.0)
Monocytes Relative: 10 % (ref 3–12)
Neutro Abs: 1.4 10*3/uL — ABNORMAL LOW (ref 1.7–7.7)
Neutrophils Relative %: 41 % — ABNORMAL LOW (ref 43–77)
PLATELETS: 205 10*3/uL (ref 150–400)
RBC: 4.32 MIL/uL (ref 3.87–5.11)
RDW: 12.8 % (ref 11.5–15.5)
WBC: 3.3 10*3/uL — AB (ref 4.0–10.5)

## 2014-08-13 LAB — BASIC METABOLIC PANEL
Anion gap: 7 (ref 5–15)
BUN: 9 mg/dL (ref 6–23)
CALCIUM: 8.9 mg/dL (ref 8.4–10.5)
CHLORIDE: 110 mmol/L (ref 96–112)
CO2: 22 mmol/L (ref 19–32)
Creatinine, Ser: 0.68 mg/dL (ref 0.50–1.10)
GFR calc Af Amer: >90 mL/min (ref >90)
Glucose, Bld: 93 mg/dL (ref 70–99)
POTASSIUM: 4.1 mmol/L (ref 3.5–5.1)
Sodium: 139 mmol/L (ref 135–145)

## 2014-08-13 LAB — URINALYSIS, ROUTINE W REFLEX MICROSCOPIC
Bilirubin Urine: NEGATIVE
Glucose, UA: NEGATIVE mg/dL
Hgb urine dipstick: NEGATIVE
KETONES UR: NEGATIVE mg/dL
Leukocytes, UA: NEGATIVE
NITRITE: NEGATIVE
PH: 6 (ref 5.0–8.0)
PROTEIN: NEGATIVE mg/dL
Specific Gravity, Urine: 1.015 (ref 1.005–1.030)
Urobilinogen, UA: 2 mg/dL — ABNORMAL HIGH (ref 0.0–1.0)

## 2014-08-13 LAB — POC URINE PREG, ED: PREG TEST UR: NEGATIVE

## 2014-08-13 MED ORDER — HYDROMORPHONE HCL 1 MG/ML IJ SOLN
0.5000 mg | Freq: Once | INTRAMUSCULAR | Status: AC
  Administered 2014-08-13: 0.5 mg via INTRAVENOUS
  Filled 2014-08-13: qty 1

## 2014-08-13 MED ORDER — RANITIDINE HCL 150 MG PO CAPS: 150.0000 mg | ORAL_CAPSULE | Freq: Every day | ORAL | Status: DC

## 2014-08-13 MED ORDER — TRAMADOL HCL 50 MG PO TABS: 50.0000 mg | ORAL_TABLET | Freq: Four times a day (QID) | ORAL | Status: DC | PRN

## 2014-08-13 MED ORDER — ONDANSETRON HCL 4 MG/2ML IJ SOLN
4.0000 mg | Freq: Once | INTRAMUSCULAR | Status: AC
  Administered 2014-08-13: 4 mg via INTRAVENOUS
  Filled 2014-08-13: qty 2

## 2014-08-13 MED ORDER — PANTOPRAZOLE SODIUM 40 MG IV SOLR
40.0000 mg | Freq: Once | INTRAVENOUS | Status: AC
Start: 2014-08-13 — End: 2014-08-13
  Administered 2014-08-13: 40 mg via INTRAVENOUS
  Filled 2014-08-13: qty 40

## 2014-08-13 NOTE — Discharge Instructions (Signed)
Follow up with your family md next week for recheck °

## 2014-08-13 NOTE — ED Notes (Signed)
Pt in XRAY 

## 2014-08-13 NOTE — ED Notes (Signed)
Pt reports abdominal pain and side pain that is worse when she is working. She DENIES nausea, vomiting, or diarrhea. Denies urinary symptoms.

## 2014-08-13 NOTE — ED Notes (Signed)
Pt ambulated to lobby with steady gait. She is alert and oriented upon DC. She was advised to follow up with her PCP this week.

## 2014-08-13 NOTE — ED Provider Notes (Signed)
CSN: [id number]     Arrival date & time 08/13/14  1112 History   First MD Initiated Contact with Patient 08/13/14 1258     Chief Complaint  Patient presents with  . Abdominal Pain     (Consider location/radiation/quality/duration/timing/severity/associated sxs/prior Treatment) Patient is a 34 y.o. female presenting with abdominal pain. The history is provided by the patient (pt complains of abd pain for over a week).  Abdominal Pain Pain location:  RUQ Pain quality: aching   Pain radiates to:  Does not radiate Pain severity:  Moderate Onset quality:  Gradual Timing:  Intermittent Progression:  Waxing and waning Chronicity:  New Context: not alcohol use   Associated symptoms: no chest pain, no cough, no diarrhea, no fatigue and no hematuria     Past Medical History  Diagnosis Date  . Asthma    History reviewed. No pertinent past surgical history. No family history on file. History  Substance Use Topics  . Smoking status: Current Every Day Smoker    Types: Cigarettes  . Smokeless tobacco: Not on file  . Alcohol Use: No   OB History    No data available     Review of Systems  Constitutional: Negative for appetite change and fatigue.  HENT: Negative for congestion, ear discharge and sinus pressure.   Eyes: Negative for discharge.  Respiratory: Negative for cough.   Cardiovascular: Negative for chest pain.  Gastrointestinal: Positive for abdominal pain. Negative for diarrhea.  Genitourinary: Negative for frequency and hematuria.  Musculoskeletal: Negative for back pain.  Skin: Negative for rash.  Neurological: Negative for seizures and headaches.  Psychiatric/Behavioral: Negative for hallucinations.      Allergies  Review of patient's allergies indicates no known allergies.  Home Medications   Prior to Admission medications   Medication Sig Start Date End Date Taking? Authorizing Provider  albuterol (PROVENTIL HFA;VENTOLIN HFA) 108 (90 BASE) MCG/ACT inhaler  Inhale 2 puffs into the lungs every 4 (four) hours as needed for wheezing or shortness of breath.    Yes Historical Provider, MD  ibuprofen (ADVIL,MOTRIN) 200 MG tablet Take 600-1,000 mg by mouth every 6 (six) hours as needed for fever, headache, moderate pain or cramping.   Yes Historical Provider, MD  ranitidine (ZANTAC) 150 MG capsule Take 1 capsule (150 mg total) by mouth daily. 08/13/14   Benny LennertJoseph L Lashika Erker, MD  traMADol (ULTRAM) 50 MG tablet Take 1 tablet (50 mg total) by mouth every 6 (six) hours as needed. 08/13/14   Benny LennertJoseph L Eli Pattillo, MD   BP 119/56 mmHg  Pulse 60  Temp(Src) 98.1 F (36.7 C) (Oral)  Resp 16  SpO2 97%  LMP 07/18/2014 Physical Exam  Constitutional: She is oriented to person, place, and time. She appears well-developed.  HENT:  Head: Normocephalic.  Eyes: Conjunctivae and EOM are normal. No scleral icterus.  Neck: Neck supple. No thyromegaly present.  Cardiovascular: Normal rate and regular rhythm.  Exam reveals no gallop and no friction rub.   No murmur heard. Pulmonary/Chest: No stridor. She has no wheezes. She has no rales. She exhibits no tenderness.  Abdominal: She exhibits no distension. There is tenderness. There is no rebound.  Tender ruq mild  Musculoskeletal: Normal range of motion. She exhibits no edema.  Mild tender left middle finger with pain with movement  Lymphadenopathy:    She has no cervical adenopathy.  Neurological: She is oriented to person, place, and time. She exhibits normal muscle tone. Coordination normal.  Skin: No rash noted. No erythema.  Psychiatric:  She has a normal mood and affect. Her behavior is normal.    ED Course  Procedures (including critical care time) Labs Review Labs Reviewed  CBC WITH DIFFERENTIAL/PLATELET - Abnormal; Notable for the following:    WBC 3.3 (*)    Neutrophils Relative % 41 (*)    Neutro Abs 1.4 (*)    All other components within normal limits  URINALYSIS, ROUTINE W REFLEX MICROSCOPIC - Abnormal;  Notable for the following:    Urobilinogen, UA 2.0 (*)    All other components within normal limits  BASIC METABOLIC PANEL  POC URINE PREG, ED    Imaging Review Dg Abd Acute W/chest  08/13/2014   CLINICAL DATA:  Upper abdominal pain for 1 week.  EXAM: ACUTE ABDOMEN SERIES (ABDOMEN 2 VIEW & CHEST 1 VIEW)  COMPARISON:  None.  FINDINGS: Single view of the chest demonstrates clear lungs and normal heart size. No pneumothorax or pleural effusion.  Two views of the abdomen show no free intraperitoneal air. The bowel gas pattern is normal. No unexpected abdominal calcification or bony abnormality is identified  IMPRESSION: Negative exam.   Electronically Signed   By: Drusilla Kanner M.D.   On: 08/13/2014 14:02   Dg Hand Complete Left  08/13/2014   CLINICAL DATA:  Pt states she has been having left hand pain primarily in middle finger x 1 Month. States its a "burning" sensation. Pt works with hands everyday at work and thinks her work irritates it.  EXAM: LEFT HAND - COMPLETE 3+ VIEW  COMPARISON:  None.  FINDINGS: There is no evidence of fracture or dislocation. There is no evidence of arthropathy or other focal bone abnormality. Soft tissues are unremarkable.  IMPRESSION: Negative.   Electronically Signed   By: Amie Portland M.D.   On: 08/13/2014 14:03     EKG Interpretation None      MDM   Final diagnoses:  Pain  Abdominal pain, acute    Abdominal pain will treat for GERD.   tx with zantac and ultram    Benny Lennert, MD 08/13/14 [phone]

## 2014-08-13 NOTE — ED Notes (Signed)
MD Zammit at bedside. 

## 2014-08-13 NOTE — ED Notes (Signed)
Pt ambulated to restroom with steady gait.

## 2014-08-13 NOTE — ED Notes (Signed)
Per pt, states abdominal pain for about a week-no N/V

## 2014-10-07 ENCOUNTER — Emergency Department (HOSPITAL_COMMUNITY)
Admission: EM | Admit: 2014-10-07 | Discharge: 2014-10-07 | Disposition: A | Discharge status: Home / Self Care | Payer: Self-pay | Attending: Emergency Medicine | Admitting: Emergency Medicine

## 2014-10-07 ENCOUNTER — Emergency Department (HOSPITAL_COMMUNITY): Payer: Self-pay

## 2014-10-07 ENCOUNTER — Encounter (HOSPITAL_COMMUNITY): Payer: Self-pay

## 2014-10-07 DIAGNOSIS — R000 Tachycardia, unspecified: Secondary | ICD-10-CM | POA: Insufficient documentation

## 2014-10-07 DIAGNOSIS — J069 Acute upper respiratory infection, unspecified: Secondary | ICD-10-CM | POA: Insufficient documentation

## 2014-10-07 DIAGNOSIS — R05 Cough: Secondary | ICD-10-CM

## 2014-10-07 DIAGNOSIS — R059 Cough, unspecified: Secondary | ICD-10-CM

## 2014-10-07 DIAGNOSIS — J45909 Unspecified asthma, uncomplicated: Secondary | ICD-10-CM | POA: Insufficient documentation

## 2014-10-07 DIAGNOSIS — Z720 Tobacco use: Secondary | ICD-10-CM | POA: Insufficient documentation

## 2014-10-07 DIAGNOSIS — Z79899 Other long term (current) drug therapy: Secondary | ICD-10-CM | POA: Insufficient documentation

## 2014-10-07 MED ORDER — PREDNISONE 20 MG PO TABS: 60.0000 mg | ORAL_TABLET | Freq: Every day | ORAL | Status: DC

## 2014-10-07 MED ORDER — BENZONATATE 100 MG PO CAPS: 100.0000 mg | ORAL_CAPSULE | Freq: Three times a day (TID) | ORAL | Status: DC

## 2014-10-07 MED ORDER — ALBUTEROL SULFATE (2.5 MG/3ML) 0.083% IN NEBU
5.0000 mg | INHALATION_SOLUTION | Freq: Once | RESPIRATORY_TRACT | Status: AC
  Administered 2014-10-07: 5 mg via RESPIRATORY_TRACT
  Filled 2014-10-07: qty 6

## 2014-10-07 MED ORDER — IPRATROPIUM BROMIDE 0.02 % IN SOLN
0.5000 mg | Freq: Once | RESPIRATORY_TRACT | Status: AC
  Administered 2014-10-07: 0.5 mg via RESPIRATORY_TRACT
  Filled 2014-10-07: qty 2.5

## 2014-10-07 MED ORDER — PREDNISONE 20 MG PO TABS
60.0000 mg | ORAL_TABLET | Freq: Once | ORAL | Status: AC
  Administered 2014-10-07: 60 mg via ORAL
  Filled 2014-10-07: qty 3

## 2014-10-07 NOTE — Discharge Instructions (Signed)

## 2014-10-07 NOTE — ED Notes (Signed)
Pt presents with c/o generalized body aches, cough, and headache. Pt reports her symptoms started this morning. Ambulatory to room.

## 2014-10-07 NOTE — ED Provider Notes (Signed)
CSN: [id number]     Arrival date & time 10/07/14  1623 History   First MD Initiated Contact with Patient 10/07/14 1624     Chief Complaint  Patient presents with  . Cough  . Generalized Body Aches  . Headache     (Consider location/radiation/quality/duration/timing/severity/associated sxs/prior Treatment) HPI Comments: Patient with a history of Asthma presents today with complaints of headache, body aches, congestion, and cough.  She reports onset of symptoms this morning and that the symptoms have gradually been worsening since that time.  She has not taken anything for symptoms prior to arrival.  She states that she has had associated tightness in her chest and SOB.  Denies hemoptysis.  She also reports associated nausea, but denies vomiting.  She reports that she has felt febrile, but has not taken her temperature.  Temp 98.3 F orally upon arrival in the ED.  She has had recent sick contacts.  The history is provided by the patient.    Past Medical History  Diagnosis Date  . Asthma    No past surgical history on file. No family history on file. History  Substance Use Topics  . Smoking status: Current Every Day Smoker    Types: Cigarettes  . Smokeless tobacco: Not on file  . Alcohol Use: No   OB History    No data available     Review of Systems  All other systems reviewed and are negative.     Allergies  Review of patient's allergies indicates no known allergies.  Home Medications   Prior to Admission medications   Medication Sig Start Date End Date Taking? Authorizing Provider  albuterol (PROVENTIL HFA;VENTOLIN HFA) 108 (90 BASE) MCG/ACT inhaler Inhale 2 puffs into the lungs every 4 (four) hours as needed for wheezing or shortness of breath.     Historical Provider, MD  ibuprofen (ADVIL,MOTRIN) 200 MG tablet Take 600-1,000 mg by mouth every 6 (six) hours as needed for fever, headache, moderate pain or cramping.    Historical Provider, MD  ranitidine (ZANTAC) 150  MG capsule Take 1 capsule (150 mg total) by mouth daily. 08/13/14   Bethann Berkshire, MD  traMADol (ULTRAM) 50 MG tablet Take 1 tablet (50 mg total) by mouth every 6 (six) hours as needed. 08/13/14   Bethann Berkshire, MD   BP 143/63 mmHg  Pulse 103  Temp(Src) 100.5 F (38.1 C) (Oral)  Resp 112  SpO2 99%  LMP 09/22/2014 Physical Exam  Constitutional: She appears well-developed and well-nourished.  HENT:  Head: Normocephalic and atraumatic.  Mouth/Throat: Oropharynx is clear and moist.  Eyes: EOM are normal. Pupils are equal, round, and reactive to light.  Neck: Normal range of motion. Neck supple.  Cardiovascular: Normal rate, regular rhythm and normal heart sounds.   Pulmonary/Chest: Effort normal and breath sounds normal.  Breath sounds mildly decreased diffusely  Abdominal: Soft. Bowel sounds are normal. She exhibits no distension and no mass. There is no tenderness. There is no rebound and no guarding.  Musculoskeletal: Normal range of motion.  Neurological: She is alert. She has normal strength. No cranial nerve deficit or sensory deficit. Coordination and gait normal.  Skin: Skin is warm and dry.  Psychiatric: She has a normal mood and affect.  Nursing note and vitals reviewed.   ED Course  Procedures (including critical care time) Labs Review Labs Reviewed - No data to display  Imaging Review Dg Chest 2 View  10/07/2014   CLINICAL DATA:  34 year old female with history of cough,  shortness breath, fever and congestion for 1 day.  EXAM: CHEST  2 VIEW  COMPARISON:  Chest x-ray 08/13/2014.  FINDINGS: Lung volumes are normal. No consolidative airspace disease. No pleural effusions. No pneumothorax. No pulmonary nodule or mass noted. Pulmonary vasculature and the cardiomediastinal silhouette are within normal limits.  IMPRESSION: No radiographic evidence of acute cardiopulmonary disease.   Electronically Signed   By: Trudie Reedaniel  Entrikin M.D.   On: 10/07/2014 17:51     EKG  Interpretation None     6:07 PM Reassessed patient.  She reports that her breathing has improved. MDM   Final diagnoses:  None   Patient presents today with fever, cough, body aches, headache, and congestion.  She is non toxic appearing.  Vital signs are stable aside from very mild tachycardia.  CXR is negative.  Suspect viral illness, possibly influenza.  Patient is otherwise healthy aside from history of Asthma.  Pulse ox 99 on RA.  Feel that the patient is stable for discharge.  Return precautions given.    Santiago GladHeather Quantay Zaremba, PA-C 10/09/14 [id number]  Elwin MochaBlair Walden, MD 10/09/14 [phone]

## 2015-06-30 ENCOUNTER — Encounter (HOSPITAL_COMMUNITY): Payer: Self-pay | Admitting: Emergency Medicine

## 2015-06-30 ENCOUNTER — Emergency Department (HOSPITAL_COMMUNITY)
Admission: EM | Admit: 2015-06-30 | Discharge: 2015-06-30 | Disposition: A | Discharge status: Home / Self Care | Payer: Self-pay | Attending: Emergency Medicine | Admitting: Emergency Medicine

## 2015-06-30 DIAGNOSIS — Z79899 Other long term (current) drug therapy: Secondary | ICD-10-CM | POA: Insufficient documentation

## 2015-06-30 DIAGNOSIS — Z7952 Long term (current) use of systemic steroids: Secondary | ICD-10-CM | POA: Insufficient documentation

## 2015-06-30 DIAGNOSIS — M79671 Pain in right foot: Secondary | ICD-10-CM | POA: Insufficient documentation

## 2015-06-30 DIAGNOSIS — J45909 Unspecified asthma, uncomplicated: Secondary | ICD-10-CM | POA: Insufficient documentation

## 2015-06-30 DIAGNOSIS — R269 Unspecified abnormalities of gait and mobility: Secondary | ICD-10-CM | POA: Insufficient documentation

## 2015-06-30 DIAGNOSIS — F17210 Nicotine dependence, cigarettes, uncomplicated: Secondary | ICD-10-CM | POA: Insufficient documentation

## 2015-06-30 DIAGNOSIS — R600 Localized edema: Secondary | ICD-10-CM | POA: Insufficient documentation

## 2015-06-30 MED ORDER — COLCHICINE 0.6 MG PO TABS: 0.6000 mg | ORAL_TABLET | Freq: Every day | ORAL | Status: AC

## 2015-06-30 MED ORDER — PREDNISONE 20 MG PO TABS: ORAL_TABLET | ORAL | Status: AC

## 2015-06-30 MED ORDER — INDOMETHACIN 25 MG PO CAPS: 25.0000 mg | ORAL_CAPSULE | Freq: Three times a day (TID) | ORAL | Status: AC | PRN

## 2015-06-30 NOTE — ED Notes (Signed)
Pt. Stated, i starteed having rt. Foot pain for no reason.

## 2015-06-30 NOTE — ED Provider Notes (Signed)
CSN: [id number]     Arrival date & time 06/30/15  4098 History  By signing my name below, I, Soijett Blue, attest that this documentation has been prepared under the direction and in the presence of Fayrene Helper, PA-C Electronically Signed: Soijett Blue, ED Scribe. 06/30/2015. 10:23 AM.   Chief Complaint  Patient presents with  . Foot Pain      The history is provided by the patient. No language interpreter was used.    Valerie Costa is a 34 y.o. female  who presents to the Emergency Department complaining of burning/shooting right foot pain onset 3 days. She notes that she began to have right foot pain for no reason. Pt denies injury/trauma/fall. She states that her right foot pain is worsened during the day and that her pain is not alleviated with rest. She notes that these are new symptoms and that she has not changed into new shoes but she is a housekeeper and walks a lot. Pt is having associated symptoms of joint swelling, redness, and gait problem due to pain. She notes that she has tried gabapentin, vicodin, and ibuprofen with no relief of her symptoms. She denies color change, wound, rash, numbness, and any other symptoms. Denies PMHx of gout. Denies having a PCP at this time.    Past Medical History  Diagnosis Date  . Asthma    History reviewed. No pertinent past surgical history. No family history on file. Social History  Substance Use Topics  . Smoking status: Current Every Day Smoker    Types: Cigarettes  . Smokeless tobacco: None  . Alcohol Use: No   OB History    No data available     Review of Systems  Musculoskeletal: Positive for joint swelling, arthralgias (right foot) and gait problem (due to pain).  Skin: Positive for color change. Negative for rash and wound.      Allergies  Review of patient's allergies indicates no known allergies.  Home Medications   Prior to Admission medications   Medication Sig Start Date End Date Taking? Authorizing  Provider  albuterol (PROVENTIL HFA;VENTOLIN HFA) 108 (90 BASE) MCG/ACT inhaler Inhale 2 puffs into the lungs every 4 (four) hours as needed for wheezing or shortness of breath.     Historical Provider, MD  benzonatate (TESSALON) 100 MG capsule Take 1 capsule (100 mg total) by mouth every 8 (eight) hours. 10/07/14   Heather Laisure, PA-C  ibuprofen (ADVIL,MOTRIN) 200 MG tablet Take 600-1,000 mg by mouth every 6 (six) hours as needed for fever, headache, moderate pain or cramping.    Historical Provider, MD  predniSONE (DELTASONE) 20 MG tablet Take 3 tablets (60 mg total) by mouth daily. 10/07/14   Heather Laisure, PA-C  ranitidine (ZANTAC) 150 MG capsule Take 1 capsule (150 mg total) by mouth daily. 08/13/14   Bethann Berkshire, MD  traMADol (ULTRAM) 50 MG tablet Take 1 tablet (50 mg total) by mouth every 6 (six) hours as needed. 08/13/14   Bethann Berkshire, MD   BP 119/83 mmHg  Pulse 75  Temp(Src) 98.3 F (36.8 C) (Oral)  Resp 16  Ht  (1.575 m)  Wt 160 lb (72.576 kg)  BMI 29.26 kg/m2  SpO2 100%  LMP 06/14/2015 Physical Exam  Constitutional: She is oriented to person, place, and time. She appears well-developed and well-nourished. No distress.  HENT:  Head: Normocephalic and atraumatic.  Eyes: EOM are normal.  Neck: Neck supple.  Cardiovascular: Normal rate.   Pulmonary/Chest: Effort normal. No respiratory distress.  Abdominal: Soft. There is no tenderness.  Musculoskeletal: Normal range of motion.  Right foot: mild edema and erythema noted to the dorsum of foot more prominent at the first MTP with TTP. Sensation inatct. Brisk cap refill.   Neurological: She is alert and oriented to person, place, and time.  Skin: Skin is warm and dry.  Psychiatric: She has a normal mood and affect. Her behavior is normal.  Nursing note and vitals reviewed.   ED Course  Procedures (including critical care time) DIAGNOSTIC STUDIES: Oxygen Saturation is 100% on RA, nl by my interpretation.     COORDINATION OF CARE: 10:22 AM-I suspect that this is probable gout of the right foot. There is a low suspicion for infection at this time but the area is warm to the touch and erythematous  Will treat the pt with deltasone, indocin, and colchicine Rx. Discussed treatment plan with pt at bedside which includes f/u to the ED in 48 hours if symptoms worsen and pt agreed to plan.    MDM   Final diagnoses:  Right foot pain    BP 119/83 mmHg  Pulse 75  Temp(Src) 98.3 F (36.8 C) (Oral)  Resp 16  Ht 5\' 2"  (1.575 m)  Wt 72.576 kg  BMI 29.26 kg/m2  SpO2 100%  LMP 06/14/2015  I personally performed the services described in this documentation, which was scribed in my presence. The recorded information has been reviewed and is accurate.      Fayrene HelperBowie Derinda Bartus, PA-C 06/30/15 1027  Rolland PorterMark James, MD 07/01/15 [phone]

## 2015-06-30 NOTE — Discharge Instructions (Signed)
Your pain is suggestive of a gout flare.  Follow instruction below.  Take medications as prescribed.  Return in 48 hrs if no improvement.  Gout Gout is an inflammatory arthritis caused by a buildup of uric acid crystals in the joints. Uric acid is a chemical that is normally present in the blood. When the level of uric acid in the blood is too high it can form crystals that deposit in your joints and tissues. This causes joint redness, soreness, and swelling (inflammation). Repeat attacks are common. Over time, uric acid crystals can form into masses (tophi) near a joint, destroying bone and causing disfigurement. Gout is treatable and often preventable. CAUSES  The disease begins with elevated levels of uric acid in the blood. Uric acid is produced by your body when it breaks down a naturally found substance called purines. Certain foods you eat, such as meats and fish, contain high amounts of purines. Causes of an elevated uric acid level include:  Being passed down from parent to child (heredity).  Diseases that cause increased uric acid production (such as obesity, psoriasis, and certain cancers).  Excessive alcohol use.  Diet, especially diets rich in meat and seafood.  Medicines, including certain cancer-fighting medicines (chemotherapy), water pills (diuretics), and aspirin.  Chronic kidney disease. The kidneys are no longer able to remove uric acid well.  Problems with metabolism. Conditions strongly associated with gout include:  Obesity.  High blood pressure.  High cholesterol.  Diabetes. Not everyone with elevated uric acid levels gets gout. It is not understood why some people get gout and others do not. Surgery, joint injury, and eating too much of certain foods are some of the factors that can lead to gout attacks. SYMPTOMS   An attack of gout comes on quickly. It causes intense pain with redness, swelling, and warmth in a joint.  Fever can occur.  Often, only one  joint is involved. Certain joints are more commonly involved:  Base of the big toe.  Knee.  Ankle.  Wrist.  Finger. Without treatment, an attack usually goes away in a few days to weeks. Between attacks, you usually will not have symptoms, which is different from many other forms of arthritis. DIAGNOSIS  Your caregiver will suspect gout based on your symptoms and exam. In some cases, tests may be recommended. The tests may include:  Blood tests.  Urine tests.  X-rays.  Joint fluid exam. This exam requires a needle to remove fluid from the joint (arthrocentesis). Using a microscope, gout is confirmed when uric acid crystals are seen in the joint fluid. TREATMENT  There are two phases to gout treatment: treating the sudden onset (acute) attack and preventing attacks (prophylaxis).  Treatment of an Acute Attack.  Medicines are used. These include anti-inflammatory medicines or steroid medicines.  An injection of steroid medicine into the affected joint is sometimes necessary.  The painful joint is rested. Movement can worsen the arthritis.  You may use warm or cold treatments on painful joints, depending which works best for you.  Treatment to Prevent Attacks.  If you suffer from frequent gout attacks, your caregiver may advise preventive medicine. These medicines are started after the acute attack subsides. These medicines either help your kidneys eliminate uric acid from your body or decrease your uric acid production. You may need to stay on these medicines for a very long time.  The early phase of treatment with preventive medicine can be associated with an increase in acute gout attacks. For this  reason, during the first few months of treatment, your caregiver may also advise you to take medicines usually used for acute gout treatment. Be sure you understand your caregiver's directions. Your caregiver may make several adjustments to your medicine dose before these medicines  are effective.  Discuss dietary treatment with your caregiver or dietitian. Alcohol and drinks high in sugar and fructose and foods such as meat, poultry, and seafood can increase uric acid levels. Your caregiver or dietitian can advise you on drinks and foods that should be limited. HOME CARE INSTRUCTIONS   Do not take aspirin to relieve pain. This raises uric acid levels.  Only take over-the-counter or prescription medicines for pain, discomfort, or fever as directed by your caregiver.  Rest the joint as much as possible. When in bed, keep sheets and blankets off painful areas.  Keep the affected joint raised (elevated).  Apply warm or cold treatments to painful joints. Use of warm or cold treatments depends on which works best for you.  Use crutches if the painful joint is in your leg.  Drink enough fluids to keep your urine clear or pale yellow. This helps your body get rid of uric acid. Limit alcohol, sugary drinks, and fructose drinks.  Follow your dietary instructions. Pay careful attention to the amount of protein you eat. Your daily diet should emphasize fruits, vegetables, whole grains, and fat-free or low-fat milk products. Discuss the use of coffee, vitamin C, and cherries with your caregiver or dietitian. These may be helpful in lowering uric acid levels.  Maintain a healthy body weight. SEEK MEDICAL CARE IF:   You develop diarrhea, vomiting, or any side effects from medicines.  You do not feel better in 24 hours, or you are getting worse. SEEK IMMEDIATE MEDICAL CARE IF:   Your joint becomes suddenly more tender, and you have chills or a fever. MAKE SURE YOU:   Understand these instructions.  Will watch your condition.  Will get help right away if you are not doing well or get worse.   This information is not intended to replace advice given to you by your health care provider. Make sure you discuss any questions you have with your health care provider.   Document  Released: 06/27/2000 Document Revised: 07/21/2014 Document Reviewed: 02/11/2012 Elsevier Interactive Patient Education [employer]

## 2015-07-01 ENCOUNTER — Emergency Department (HOSPITAL_COMMUNITY): Payer: Self-pay

## 2015-07-01 ENCOUNTER — Emergency Department (HOSPITAL_COMMUNITY)
Admission: EM | Admit: 2015-07-01 | Discharge: 2015-07-02 | Disposition: A | Discharge status: Home / Self Care | Payer: Self-pay | Attending: Emergency Medicine | Admitting: Emergency Medicine

## 2015-07-01 ENCOUNTER — Encounter (HOSPITAL_COMMUNITY): Payer: Self-pay

## 2015-07-01 DIAGNOSIS — Z7952 Long term (current) use of systemic steroids: Secondary | ICD-10-CM | POA: Insufficient documentation

## 2015-07-01 DIAGNOSIS — Y929 Unspecified place or not applicable: Secondary | ICD-10-CM | POA: Insufficient documentation

## 2015-07-01 DIAGNOSIS — S92214A Nondisplaced fracture of cuboid bone of right foot, initial encounter for closed fracture: Secondary | ICD-10-CM | POA: Insufficient documentation

## 2015-07-01 DIAGNOSIS — J45909 Unspecified asthma, uncomplicated: Secondary | ICD-10-CM | POA: Insufficient documentation

## 2015-07-01 DIAGNOSIS — M109 Gout, unspecified: Secondary | ICD-10-CM | POA: Insufficient documentation

## 2015-07-01 DIAGNOSIS — Y999 Unspecified external cause status: Secondary | ICD-10-CM | POA: Insufficient documentation

## 2015-07-01 DIAGNOSIS — X58XXXA Exposure to other specified factors, initial encounter: Secondary | ICD-10-CM | POA: Insufficient documentation

## 2015-07-01 DIAGNOSIS — Z79899 Other long term (current) drug therapy: Secondary | ICD-10-CM | POA: Insufficient documentation

## 2015-07-01 DIAGNOSIS — Y939 Activity, unspecified: Secondary | ICD-10-CM | POA: Insufficient documentation

## 2015-07-01 DIAGNOSIS — S92211A Displaced fracture of cuboid bone of right foot, initial encounter for closed fracture: Secondary | ICD-10-CM

## 2015-07-01 DIAGNOSIS — F17210 Nicotine dependence, cigarettes, uncomplicated: Secondary | ICD-10-CM | POA: Insufficient documentation

## 2015-07-01 LAB — CBC WITH DIFFERENTIAL/PLATELET
Basophils Absolute: 0 10*3/uL (ref 0.0–0.1)
Basophils Relative: 0 %
Eosinophils Absolute: 0.2 10*3/uL (ref 0.0–0.7)
Eosinophils Relative: 4 %
HEMATOCRIT: 35.7 % — AB (ref 36.0–46.0)
HEMOGLOBIN: 12.4 g/dL (ref 12.0–15.0)
LYMPHS ABS: 2.2 10*3/uL (ref 0.7–4.0)
LYMPHS PCT: 38 %
MCH: 30.8 pg (ref 26.0–34.0)
MCHC: 34.7 g/dL (ref 30.0–36.0)
MCV: 88.6 fL (ref 78.0–100.0)
Monocytes Absolute: 0.7 10*3/uL (ref 0.1–1.0)
Monocytes Relative: 12 %
NEUTROS PCT: 46 %
Neutro Abs: 2.6 10*3/uL (ref 1.7–7.7)
Platelets: 163 10*3/uL (ref 150–400)
RBC: 4.03 MIL/uL (ref 3.87–5.11)
RDW: 12.8 % (ref 11.5–15.5)
WBC: 5.7 10*3/uL (ref 4.0–10.5)

## 2015-07-01 LAB — BASIC METABOLIC PANEL
Anion gap: 6 (ref 5–15)
BUN: 10 mg/dL (ref 6–20)
CO2: 27 mmol/L (ref 22–32)
Calcium: 8.9 mg/dL (ref 8.9–10.3)
Chloride: 106 mmol/L (ref 101–111)
Creatinine, Ser: 0.79 mg/dL (ref 0.44–1.00)
GFR calc Af Amer: >60 mL/min (ref >60)
GFR calc non Af Amer: >60 mL/min (ref >60)
GLUCOSE: 93 mg/dL (ref 65–99)
POTASSIUM: 4.1 mmol/L (ref 3.5–5.1)
Sodium: 139 mmol/L (ref 135–145)

## 2015-07-01 MED ORDER — KETOROLAC TROMETHAMINE 30 MG/ML IJ SOLN
30.0000 mg | Freq: Once | INTRAMUSCULAR | Status: AC
  Administered 2015-07-01: 30 mg via INTRAVENOUS
  Filled 2015-07-01: qty 1

## 2015-07-01 MED ORDER — HYDROMORPHONE HCL 1 MG/ML IJ SOLN
1.0000 mg | Freq: Once | INTRAMUSCULAR | Status: AC
  Administered 2015-07-01: 1 mg via INTRAVENOUS
  Filled 2015-07-01: qty 1

## 2015-07-01 NOTE — ED Notes (Addendum)
Pt states that her right foot started swelling and hurting for no reason. Pt was seen at [facility] yesterday for same and was given "medication for gout, but that didn't work." Pt states that pain in radiating up her leg. Swelling noted in right foot in triage.

## 2015-07-02 MED ORDER — HYDROMORPHONE HCL 1 MG/ML IJ SOLN
1.0000 mg | Freq: Once | INTRAMUSCULAR | Status: AC
  Administered 2015-07-02: 1 mg via INTRAVENOUS
  Filled 2015-07-02: qty 1

## 2015-07-02 MED ORDER — OXYCODONE-ACETAMINOPHEN 5-325 MG PO TABS: 1.0000 | ORAL_TABLET | ORAL | Status: AC | PRN

## 2015-07-02 NOTE — ED Notes (Signed)
Patient was alert, oriented and stable upon discharge. RN went over AVS and patient had no further questions.  

## 2015-07-02 NOTE — Discharge Instructions (Signed)
It appears that you have a fracture in the cuboid bone of the middle of your foot which is likely secondary to overuse and pressure on the area from being on your feet so often.  Apply ice to your foot and keep it elevated as much as possible.  Do not put pressure on your foot or walk on your foot.  Follow up with one of the bone doctors whose information is provided as soon as possible.  Take the pain medication as prescribed.  Stress Fracture Stress fracture is a small break or crack in a bone. A stress fracture can be fully broken (complete) or partially broken (incomplete). The most common sites for stress fractures are the bones in the front of your feet (metatarsals), your heels (calcaneus), and the long bone of your lower leg (tibia). CAUSES A stress fracture is caused by overuse or repetitive exercise, such as running. It happens when a bone cannot absorb any more shock because the muscles around it are weak. Stress fractures happen most commonly when:  You rapidly increase or start a new physical activity.  You use shoes that are worn out or do not fit you properly.  You exercise on a new surface. RISK FACTORS You may be at higher risk for this type of fracture if:  You have a condition that causes weak bones (osteoporosis).  You are female. Stress fractures are more likely to occur in women. SIGNS AND SYMPTOMS The most common symptom of a stress fracture is feeling pain when you are using the affected part of your body. The pain usually goes away when you are resting. Other symptoms may include:  Swelling of the affected area.  Pain in the area when it is touched.  Decreased pain while resting. Stress fracture pain usually develops over time. DIAGNOSIS Diagnosis may include:   Medical history and physical exam.  X-rays.  Bone scan.  MRI. TREATMENT Treatment depends on the severity of your stress fracture. Treatment usually involves resting, icing, compression, and  elevation (RICE) of the affected part of your body. Treatment may also include:  Medicines to reduce inflammation.  A cast or a walking shoe.  Crutches.  Surgery. HOME CARE INSTRUCTIONS If You Have a Cast:  Do not stick anything inside the cast to scratch your skin. Doing that increases your risk of infection.  Check the skin around the cast every day. Report any concerns to your health care provider. You may put lotion on dry skin around the edges of the cast. Do not apply lotion to the skin underneath the cast.  Keep the cast clean and dry.  Cover the cast with a watertight plastic bag to protect it from water while you take a bath or a shower. Do not let the cast get wet.  Do not put pressure on any part of the cast until it is fully hardened. This may take several hours. If You Have a Walking Shoe:  Wear it as directed by your health care provider. Managing Pain, Stiffness, and Swelling  If directed, apply ice to the injured area:  Put ice in a plastic bag.  Place a towel between your skin and the bag.  Leave the ice on for 20 minutes, 2-3 times per day.  Move your fingers or toes often to avoid stiffness and to lessen swelling.  Raise the injured area above the level of your heart while you are sitting or lying down. Activity  Rest as directed by your health care provider.  Ask your health care provider if you may do alternative exercises, such as swimming or biking, while you are healing.  Return to your normal activities as directed by your health care provider. Ask your health care provider what activities are safe for you.  Perform range-of-motion exercises only as directed by your health care provider. Safety  Do not use the injured limb to support yourbody weight until your health care provider says that you can. Use crutches if your health care provider tells you to do so. General Instructions  Do not use any tobacco products, including cigarettes, chewing  tobacco, or electronic cigarettes. Tobacco can delay bone healing. If you need help quitting, ask your health care provider.  Take medicines only as directed by your health care provider.  Keep all follow-up visits as directed by your health care provider. This is important. PREVENTION  Only wear shoes that:  Fit well.  Are not worn out.  Eat a healthy diet that contains vitamin D and calcium. This helps keeps your bones strong.  Be careful when you start a new physical activity. Give your body time to adjust.  Avoid doing only one kind of activity. Do different exercises, such as swimming and running, so that no single part of your body gets overused.  Do strength-training exercises. SEEK MEDICAL CARE IF:  Your pain gets worse.  You have new symptoms.  You have increased swelling. SEEK IMMEDIATE MEDICAL CARE IF:   You lose feeling in the affected area.   This information is not intended to replace advice given to you by your health care provider. Make sure you discuss any questions you have with your health care provider.   Document Released: 09/20/2002 Document Revised: 07/21/2014 Document Reviewed: 02/02/2014 Elsevier Interactive Patient Education [employer]

## 2015-07-02 NOTE — ED Provider Notes (Signed)
CSN: [id number]     Arrival date & time 07/01/15  2133 History   First MD Initiated Contact with Patient 07/01/15 2204     Chief Complaint  Patient presents with  . Foot Pain     (Consider location/radiation/quality/duration/timing/severity/associated sxs/prior Treatment) HPI Comments: 34 year female with no significant past medical history presents for right foot pain and swelling.  The patient reports the symptoms have been progressive over the last 4 days or so.  She was seen yesterday and diagnosed with gout and given prescriptions for colchicine, prednisone, and Indocin but has not had any improvement and she has continued to have severe pain.  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  She has not had any known injury to the foot.  No fever or chills.  She denies ever having an episode like this before.  Patient reports that most of her pain is in the mid foot on the bottom of her foot.   Past Medical History  Diagnosis Date  . Asthma    History reviewed. No pertinent past surgical history. History reviewed. No pertinent family history. Social History  Substance Use Topics  . Smoking status: Current Every Day Smoker    Types: Cigarettes  . Smokeless tobacco: None  . Alcohol Use: No   OB History    No data available     Review of Systems  Constitutional: Negative for fever, chills, appetite change and fatigue.  HENT: Negative for congestion, postnasal drip and rhinorrhea.   Eyes: Negative for pain and redness.  Respiratory: Negative for cough, chest tightness and shortness of breath.   Cardiovascular: Positive for leg swelling (right foot). Negative for chest pain and palpitations.  Gastrointestinal: Negative for nausea, vomiting, abdominal pain and diarrhea.  Genitourinary: Negative for dysuria, urgency and hematuria.  Musculoskeletal: Positive for arthralgias (right foot pain and swelling). Negative for myalgias and back pain.  Skin:  Negative for rash.  Neurological: Negative for dizziness, light-headedness and numbness.  Hematological: Does not bruise/bleed easily.      Allergies  Review of patient's allergies indicates no known allergies.  Home Medications   Prior to Admission medications   Medication Sig Start Date End Date Taking? Authorizing Provider  albuterol (PROVENTIL HFA;VENTOLIN HFA) 108 (90 BASE) MCG/ACT inhaler Inhale 2 puffs into the lungs every 4 (four) hours as needed for wheezing or shortness of breath.    Yes Historical Provider, MD  colchicine 0.6 MG tablet Take 1 tablet (0.6 mg total) by mouth daily. 06/30/15  Yes Fayrene Helper, PA-C  ibuprofen (ADVIL,MOTRIN) 200 MG tablet Take 600-1,000 mg by mouth every 6 (six) hours as needed for fever, headache, moderate pain or cramping.   Yes Historical Provider, MD  indomethacin (INDOCIN) 25 MG capsule Take 1 capsule (25 mg total) by mouth 3 (three) times daily as needed. 06/30/15  Yes Fayrene Helper, PA-C  oxyCODONE-acetaminophen (ROXICET) 5-325 MG tablet Take 1-2 tablets by mouth every 4 (four) hours as needed for moderate pain or severe pain. 07/02/15   Leta Baptist, MD  predniSONE (DELTASONE) 20 MG tablet 3 tabs po day one, then 2 po daily x 4 days 06/30/15   Fayrene Helper, PA-C   BP 118/56 mmHg  Pulse 79  Temp(Src) 97.9 F (36.6 C) (Oral)  Resp 18  SpO2 99%  LMP 06/14/2015 Physical Exam  Constitutional: She is oriented to person, place, and time. She appears well-developed and well-nourished. No distress.  HENT:  Head: Normocephalic and atraumatic.  Right Ear: External ear normal.  Left Ear: External ear normal.  Nose: Nose normal.  Mouth/Throat: Oropharynx is clear and moist. No oropharyngeal exudate.  Eyes: EOM are normal. Pupils are equal, round, and reactive to light.  Neck: Normal range of motion. Neck supple.  Cardiovascular: Normal rate, regular rhythm, normal heart sounds and intact distal pulses.   No murmur heard. Pulmonary/Chest: Effort  normal. No respiratory distress. She has no wheezes. She has no rales.  Abdominal: Soft. She exhibits no distension. There is no tenderness.  Musculoskeletal: She exhibits no edema.       Right ankle: She exhibits decreased range of motion. She exhibits no swelling, no deformity and normal pulse. No tenderness.       Right foot: There is tenderness, bony tenderness and swelling. There is normal capillary refill, no crepitus and no deformity.  Patient with swelling of the right foot up to the ankle and decreased range of motion of the ankle secondary to it causing pain in the mid foot.  She has tenderness over the entire foot but most significantly over the dorsal mid foot.  Skin is not erythematous or warm to the touch.  Normal capillary refill  Neurological: She is alert and oriented to person, place, and time.  Skin: Skin is warm and dry. No rash noted. She is not diaphoretic.  Vitals reviewed.   ED Course  Procedures (including critical care time) Labs Review Labs Reviewed  CBC WITH DIFFERENTIAL/PLATELET - Abnormal; Notable for the following:    HCT 35.7 (*)    All other components within normal limits  BASIC METABOLIC PANEL    Imaging Review Dg Foot Complete Right  07/01/2015  CLINICAL DATA:  34 year old female with right foot pain and swelling EXAM: RIGHT FOOT COMPLETE - 3+ VIEW COMPARISON:  None. FINDINGS: There is focal irregularity and angulation of the medial cortex of the cuboid bone concerning for a fracture. No other fracture identified. There is diffuse soft tissue swelling of the foot. No radiopaque foreign object identified. IMPRESSION: Nondisplaced cuboid fracture. Electronically Signed   By: Elgie CollardArash  Radparvar M.D.   On: 07/01/2015 23:44   I have personally reviewed and evaluated these images and lab results as part of my medical decision-making.   EKG Interpretation None      MDM  Patient was seen and evaluated in stable condition.  No sign of infection on  examination.  Labs unremarkable.  Patient given toradol and dilaudid for pain control.  Xray with concern for nondisplaced cuboid fracture which would likely be a stress/over use injury.  Patient placed in short leg splint, provided crutches, and mad non weight bearing.  Patient discharged to follow up with orthopedics outpatient. Final diagnoses:  Cuboid fracture, right, closed, initial encounter    1. Right cuboid fracture    Leta BaptistEmily Roe Khary Schaben, MD 07/02/15 [phone]

## 2015-10-21 ENCOUNTER — Inpatient Hospital Stay
Admit: 2015-10-21 | Discharge: 2015-10-21 | Disposition: A | Discharge status: Home / Self Care | Payer: Self-pay | Attending: Emergency Medicine

## 2015-10-21 ENCOUNTER — Emergency Department: Admit: 2015-10-21 | Payer: Self-pay

## 2015-10-21 DIAGNOSIS — J45901 Unspecified asthma with (acute) exacerbation: Secondary | ICD-10-CM

## 2015-10-21 LAB — EKG, 12 LEAD, INITIAL
Atrial Rate: 92 {beats}/min
Calculated P Axis: 78 degrees
Calculated R Axis: 80 degrees
Calculated T Axis: -22 degrees
Diagnosis: NORMAL
P-R Interval: 116 ms
Q-T Interval: 364 ms
QRS Duration: 92 ms
QTC Calculation (Bezet): 450 ms
Ventricular Rate: 92 {beats}/min

## 2015-10-21 LAB — METABOLIC PANEL, COMPREHENSIVE
A-G Ratio: 1 — ABNORMAL LOW (ref 1.2–3.5)
ALT (SGPT): 17 U/L (ref 12–65)
AST (SGOT): 13 U/L — ABNORMAL LOW (ref 15–37)
Albumin: 3.4 g/dL — ABNORMAL LOW (ref 3.5–5.0)
Alk. phosphatase: 48 U/L — ABNORMAL LOW (ref 50–136)
Anion gap: 5 mmol/L — ABNORMAL LOW (ref 7–16)
BUN: 7 MG/DL (ref 6–23)
Bilirubin, total: 0.7 MG/DL (ref 0.2–1.1)
CO2: 27 mmol/L (ref 21–32)
Calcium: 8.3 MG/DL (ref 8.3–10.4)
Chloride: 109 mmol/L — ABNORMAL HIGH (ref 98–107)
Creatinine: 0.79 MG/DL (ref 0.6–1.0)
GFR est AA: >60 mL/min/{1.73_m2} (ref >60)
GFR est non-AA: >60 mL/min/{1.73_m2} (ref >60)
Globulin: 3.3 g/dL (ref 2.3–3.5)
Glucose: 79 mg/dL (ref 65–100)
Potassium: 4 mmol/L (ref 3.5–5.1)
Protein, total: 6.7 g/dL (ref 6.3–8.2)
Sodium: 141 mmol/L (ref 136–145)

## 2015-10-21 LAB — CBC WITH AUTOMATED DIFF
ABS. BASOPHILS: 0 10*3/uL (ref 0.0–0.2)
ABS. EOSINOPHILS: 0.3 10*3/uL (ref 0.0–0.8)
ABS. IMM. GRANS.: 0 10*3/uL (ref 0.0–0.5)
ABS. LYMPHOCYTES: 1.4 10*3/uL (ref 0.5–4.6)
ABS. MONOCYTES: 0.5 10*3/uL (ref 0.1–1.3)
ABS. NEUTROPHILS: 1.8 10*3/uL (ref 1.7–8.2)
BASOPHILS: 0 % (ref 0.0–2.0)
EOSINOPHILS: 7 % (ref 0.5–7.8)
HCT: 39.9 % (ref 35.8–46.3)
HGB: 14.3 g/dL (ref 11.7–15.4)
IMMATURE GRANULOCYTES: 0.3 % (ref 0.0–5.0)
LYMPHOCYTES: 35 % (ref 13–44)
MCH: 31 PG (ref 26.1–32.9)
MCHC: 35.8 g/dL — ABNORMAL HIGH (ref 31.4–35.0)
MCV: 86.4 FL (ref 79.6–97.8)
MONOCYTES: 12 % (ref 4.0–12.0)
MPV: 10.2 FL — ABNORMAL LOW (ref 10.8–14.1)
NEUTROPHILS: 46 % (ref 43–78)
PLATELET: 203 10*3/uL (ref 150–450)
RBC: 4.62 M/uL (ref 4.05–5.25)
RDW: 13 % (ref 11.9–14.6)
WBC: 4 10*3/uL — ABNORMAL LOW (ref 4.3–11.1)

## 2015-10-21 LAB — URINE MICROSCOPIC

## 2015-10-21 LAB — TROPONIN I: Troponin-I, Qt.: <0.02 NG/ML — ABNORMAL LOW (ref 0.02–0.05)

## 2015-10-21 MED ORDER — IPRATROPIUM-ALBUTEROL 2.5 MG-0.5 MG/3 ML NEB SOLUTION
2.5 mg-0.5 mg/3 ml | RESPIRATORY_TRACT | Status: AC
Start: 2015-10-21 — End: 2015-10-21
  Administered 2015-10-21: 15:00:00 via RESPIRATORY_TRACT

## 2015-10-21 MED ORDER — ALBUTEROL SULFATE HFA 90 MCG/ACTUATION AEROSOL INHALER
90 mcg/actuation | RESPIRATORY_TRACT | Status: DC | PRN
Start: 2015-10-21 — End: 2015-10-21

## 2015-10-21 MED ORDER — ALBUTEROL SULFATE HFA 90 MCG/ACTUATION AEROSOL INHALER
90 mcg/actuation | RESPIRATORY_TRACT | 4 refills | Status: DC | PRN
Start: 2015-10-21 — End: 2016-02-23

## 2015-10-21 MED ORDER — PREDNISONE 20 MG TAB
20 mg | ORAL_TABLET | Freq: Every day | ORAL | 0 refills | Status: AC
Start: 2015-10-21 — End: 2015-10-31

## 2015-10-21 MED ORDER — METHYLPREDNISOLONE (PF) 125 MG/2 ML IJ SOLR
125 mg/2 mL | Freq: Once | INTRAMUSCULAR | Status: AC
Start: 2015-10-21 — End: 2015-10-21
  Administered 2015-10-21: 17:00:00 via INTRAVENOUS

## 2015-10-21 MED ORDER — ALBUTEROL SULFATE 0.083 % (0.83 MG/ML) SOLN FOR INHALATION
2.5 mg /3 mL (0.083 %) | RESPIRATORY_TRACT | Status: AC
Start: 2015-10-21 — End: 2015-10-21
  Administered 2015-10-21: 16:00:00 via RESPIRATORY_TRACT

## 2015-10-21 MED FILL — SOLU-MEDROL (PF) 125 MG/2 ML SOLUTION FOR INJECTION: 125 mg/2 mL | INTRAMUSCULAR | Qty: 2

## 2015-10-21 MED FILL — VENTOLIN HFA 90 MCG/ACTUATION AEROSOL INHALER: 90 mcg/actuation | RESPIRATORY_TRACT | Qty: 8

## 2015-10-21 MED FILL — ALBUTEROL SULFATE 0.083 % (0.83 MG/ML) SOLN FOR INHALATION: 2.5 mg /3 mL (0.083 %) | RESPIRATORY_TRACT | Qty: 4

## 2015-10-21 MED FILL — IPRATROPIUM-ALBUTEROL 2.5 MG-0.5 MG/3 ML NEB SOLUTION: 2.5 mg-0.5 mg/3 ml | RESPIRATORY_TRACT | Qty: 3

## 2015-10-21 NOTE — ED Notes (Signed)
I have reviewed discharge instructions with the patient and caregiver.  The patient and caregiver verbalized understanding.

## 2015-10-21 NOTE — ED Triage Notes (Signed)
Pt reports shooting pain in her chest on the left side that goes to her back since yesterday.

## 2015-10-21 NOTE — ED Provider Notes (Signed)
HPI     35 year old female presenting to the emergency department for evaluation of chest pain and shortness of breath.  She states that her symptoms have been present since Friday.  She notes constant chest tightness across her chest anteriorly, but it seems to wax and wane in intensity.  Her symptoms are worse with exertion.  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  She wasable to work only for about 5-10 minutes before she became diaphoretic and had worsening chest tightness.she has no history of similar symptoms in the past.  She has no history of coronary disease.  No history of hypertension or diabetes.  She does have a history of asthma/bronchitis and has been out of her inhaler for the last 4 months.  She also notes that she feels that she's been wheezing at night.  She has no history of DVT no family history of DVT.  No new unilateral leg swelling or edema.  No recent fractures, travel outside the country, or immobilizations.    Past Medical History:   Diagnosis Date   ??? Asthma    ??? Hypertension        History reviewed. No pertinent surgical history.      History reviewed. No pertinent family history.    Social History     Social History   ??? Marital status: SINGLE     Spouse name: N/A   ??? Number of children: N/A   ??? Years of education: N/A     Occupational History   ??? Not on file.     Social History Main Topics   ??? Smoking status: Current Every Day Smoker   ??? Smokeless tobacco: Not on file   ??? Alcohol use No   ??? Drug use: Yes     Special: Marijuana   ??? Sexual activity: Not on file     Other Topics Concern   ??? Not on file     Social History Narrative         ALLERGIES: Bees [hymenoptera allergenic extract]    Review of Systems   Constitutional: Positive for diaphoresis. Negative for fever.   HENT: Negative.    Eyes: Negative.    Respiratory: Positive for chest tightness, shortness of breath and wheezing. Negative for cough and stridor.    Cardiovascular: Positive for chest pain.    Gastrointestinal: Negative for abdominal distention, abdominal pain, constipation, diarrhea and vomiting.   Endocrine: Negative.    Genitourinary: Negative for dysuria, flank pain, frequency and urgency.   Neurological: Negative for dizziness, syncope and headaches.   Psychiatric/Behavioral: Negative.    All other systems reviewed and are negative.      Vitals:    10/21/15 0933   BP: 150/82   Pulse: (!) 107   Resp: 18   Temp: 98.3 ??F (36.8 ??C)   SpO2: 100%   Weight: 72.6 kg (160 lb)   Height:  (1.575 m)            Physical Exam   Constitutional: She is oriented to person, place, and time. She appears well-developed and well-nourished. No distress.   HENT:   Head: Normocephalic and atraumatic.   Eyes: EOM are normal. Pupils are equal, round, and reactive to light.   Neck: Normal range of motion. Neck supple. No JVD present.   Cardiovascular: Normal rate, regular rhythm and normal heart sounds.  Exam reveals no gallop and no friction rub.    No murmur heard.  Pulmonary/Chest:  Effort normal. No stridor. No respiratory distress. She has wheezes ( very faint, inspiratory).   Poor aeration throughout, significantly diminished expiratory bs bilaterally   Abdominal: Soft. Bowel sounds are normal. She exhibits no distension and no mass. There is no tenderness. There is no rebound and no guarding.   Musculoskeletal: Normal range of motion. She exhibits no edema, tenderness or deformity.   Lymphadenopathy:     She has no cervical adenopathy.   Neurological: She is alert and oriented to person, place, and time. No cranial nerve deficit.   Skin: Skin is warm and dry. No rash noted. She is not diaphoretic. No erythema.   Psychiatric: She has a normal mood and affect. Her behavior is normal.   Vitals reviewed.       MDM  Number of Diagnoses or Management Options  Diagnosis management comments: Different diagnosis: Acute coronary syndrome, pulmonary embolus, dissection, pneumonia, bronchitis, asthma exacerbation     11:45 - on reevaluation after the breathing treatment, the patient feels some mild improvement.  She has  Continued diminished breath sounds globally though now has faint expiratory wheezing heard superiorly, bilaterally. Will add continuous neb and Solu-Medrol and reassessed.    Age > 50: NO  HR > 100: NO  O2 Sat on Room Air < 95%:  NO  Prior History of DVT/PE:NO  Recent Trauma or Surgery: NO  Hemoptysis: NO  Exogenous Estrogen: NO  Unilateral Leg Swelling: NO    If all questions are answered no then pt is PERC negative and is very low risk for PE. No further testing, or imaging is indicated.    Paulita FujitaJay F Tayen Narang, MD    Nonischemic EKG and negative troponin and unremarkable chest x-ray, patient is low risk by heart score.  She is feeling improved after a large volume neb.  She is now slightly tachycardic after 10 mg of albuterol.  She is moving more air on reevaluation.  I think this most likely represents an asthma exacerbation and the bronchospasm is most likely the source of her chest discomfort.  She's received Solu-Medrol here in the emergency department.    I've asked her to have a low threshold to return if her symptoms worsen in any way.  Otherwise she'll be discharged home with the albuterol MDI and prednisone                 Amount and/or Complexity of Data Reviewed  Clinical lab tests: ordered and reviewed  Tests in the radiology section of CPT??: ordered and reviewed      ED Course       EKG  Date/Time: 10/21/2015 10:01 AM  Performed by: Raylene MiyamotoBLANKENSHIP, Ido Wollman, F  Authorized by: Raylene MiyamotoBLANKENSHIP, Kaydi Kley, F     Interpretation:     Interpretation: non-specific    Rate:     ECG rate assessment: normal    Rhythm:     Rhythm: sinus rhythm    Conduction:     Conduction: normal    ST segments:     ST segments:  Normal  T waves:     T waves: non-specific

## 2015-10-21 NOTE — ED Notes (Signed)
Report given to Ginny, RN at this time.

## 2015-10-21 NOTE — ED Notes (Signed)
Patient ambulatory to rest room.

## 2016-02-22 ENCOUNTER — Emergency Department

## 2016-02-22 ENCOUNTER — Emergency Department: Admit: 2016-02-23 | Payer: Self-pay

## 2016-02-22 DIAGNOSIS — J45901 Unspecified asthma with (acute) exacerbation: Secondary | ICD-10-CM

## 2016-02-22 NOTE — ED Notes (Signed)
Patient to radiology with transport[facility]

## 2016-02-22 NOTE — ED Triage Notes (Signed)
Pt complains of right leg pain beginning yesterday. She states an increase in swelling today with history of gout. Pt also complains of shortness of breath and states she has lost her inhaler. Lung sounds clear in triage.

## 2016-02-22 NOTE — ED Notes (Signed)
Report received from Ashley, RN.  Patient care to be assumed at this time.

## 2016-02-22 NOTE — ED Provider Notes (Signed)
HPI Comments: 35 year old female presents with right  Leg pain from her knee to her foot since yesterday.  Pain has been gradually worsening with swelling today.  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  Denies any injury.  No hormone replacement therapy.  No recent travel or history of blood clots.  She has history of asthma and has been out of inhaler for the past month.  She reports increasing shortness of breath in the past 3 weeks.  Denies cough or fever.  She has some chest tightness.    Patient is a 35 y.o. female presenting with leg pain. The history is provided by the patient.   Leg Pain    Pertinent negatives include no back pain.        Past Medical History:   Diagnosis Date   ??? Asthma    ??? Hypertension        No past surgical history on file.      No family history on file.    Social History     Social History   ??? Marital status: SINGLE     Spouse name: N/A   ??? Number of children: N/A   ??? Years of education: N/A     Occupational History   ??? Not on file.     Social History Main Topics   ??? Smoking status: Current Every Day Smoker   ??? Smokeless tobacco: Not on file   ??? Alcohol use No   ??? Drug use: Yes     Special: Marijuana   ??? Sexual activity: Not on file     Other Topics Concern   ??? Not on file     Social History Narrative         ALLERGIES: Bees [hymenoptera allergenic extract]    Review of Systems   Constitutional: Negative for chills and fever.   HENT: Negative for hearing loss.    Eyes: Negative for visual disturbance.   Respiratory: Positive for shortness of breath and wheezing. Negative for cough.    Cardiovascular: Positive for chest pain and leg swelling. Negative for palpitations.   Gastrointestinal: Negative for abdominal pain, diarrhea, nausea and vomiting.   Musculoskeletal: Positive for arthralgias and myalgias. Negative for back pain.   Skin: Negative for rash.   Neurological: Negative for weakness and headaches.    Psychiatric/Behavioral: Negative for confusion. The patient is nervous/anxious.        Vitals:    02/22/16 2140   BP: 136/88   Pulse: (!) 104   Resp: 16   Temp: 98.1 ??F (36.7 ??C)   SpO2: 100%   Weight: 75.8 kg (167 lb)   Height: 5\' 2"  (1.575 m)            Physical Exam   Constitutional: She appears well-developed and well-nourished.   HENT:   Head: Normocephalic and atraumatic.   Right Ear: External ear normal.   Left Ear: External ear normal.   Nose: Nose normal.   Mouth/Throat: Oropharynx is clear and moist.   Eyes: Conjunctivae are normal. Pupils are equal, round, and reactive to light.   Neck: Normal range of motion. Neck supple.   Cardiovascular: Regular rhythm, normal heart sounds and intact distal pulses.    Pulmonary/Chest: Effort normal. No respiratory distress. She has decreased breath sounds. She has no wheezes.   Abdominal: Soft. Bowel sounds are normal. She exhibits no distension. There is no tenderness.   Musculoskeletal: Normal range of motion. She exhibits  no edema.        Right knee: She exhibits normal range of motion, no swelling, no erythema, normal alignment, no LCL laxity and normal meniscus. Tenderness found.        Right lower leg: She exhibits tenderness. She exhibits no swelling and no edema.        Legs:  Neurological: She is alert.   Skin: Skin is warm and dry.   Psychiatric: Judgment normal. Her mood appears anxious.   Anxious, tearful   Nursing note and vitals reviewed.       MDM  Number of Diagnoses or Management Options  Diagnosis management comments: Parts of this document were created using dragon voice recognition software. The chart has been reviewed but errors may still be present.    No appreciable swelling on exam.  Lung sounds diminished without wheezing, but decreased respiratory effort.  Patient tachycardic with sats 100%.  Concern for Possible PE.  Duplex and D-Dimer Ordered.  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    12:22 AM   Patient has significant improved air entry after neb.  Patient states her breathing is back to baseline.  Tachycardia resolved.  No episodes of hypoxia.  X-ray showed a small knee joint effusion.  Discussed possible gout versus other inflammatory arthritides. D dimer elevated, but low suspicion for PE with negative [place] for DVT. Discussed risks and benefits of CT chest with patient and using shared decision making, we decided to forego imaging at this time.  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  Given strict return precautions including any persistent chest pain, shortness of breath, or worsening leg swelling.  Patient is in agreement with plan.  Also given follow-up for primary care physician.     I discussed the results of all labs, procedures, radiographs, and treatments with the patient and available family.  Treatment plan is agreed upon and the patient is ready for discharge.  Questions about treatment in the ED and differential diagnosis of presenting condition were answered.  Patient was given verbal discharge instructions including, but not limited to, importance of returning to the emergency department for any concern of worsening or continued symptoms.  Instructions were given to follow up with a primary care provider or specialist within 1-2 days.  Adverse effects of medications, if prescribed, were discussed and patient was advised to refrain from significant physical activity until followed up by primary care physician and to not drive or operate heavy machinery after taking any sedating substances.           Amount and/or Complexity of Data Reviewed  Clinical lab tests: ordered and reviewed (Results for orders placed or performed during the hospital encounter of 02/22/16  -CBC WITH AUTOMATED DIFF       Result                                            Value                         Ref Range                        WBC  5.4                           4.3 - 11.1 K/uL                 RBC                                               4.67                          4.05 - 5.25 M/uL                HGB                                               14.5                          11.7 - 15.4 g/dL                HCT                                               40.3                          35.8 - 46.3 %                   MCV                                               86.3                          79.6 - 97.8 FL                  MCH                                               31.0                          26.1 - 32.9 PG                  MCHC                                              36.0 (H)                      31.4 - 35.0 g/dL  RDW                                               13.4                          11.9 - 14.6 %                   PLATELET                                          237                           150 - 450 K/uL                  MPV                                               10.6 (L)                      10.8 - 14.1 FL                  DF                                                AUTOMATED                                                     NEUTROPHILS                                       60                            43 - 78 %                       LYMPHOCYTES                                       30                            13 - 44 %                       MONOCYTES  8                             4.0 - 12.0 %                    EOSINOPHILS                                       2                             0.5 - 7.8 %                     BASOPHILS                                         0                             0.0 - 2.0 %                     IMMATURE GRANULOCYTES                             0.0                           0.0 - 5.0 %                      ABS. NEUTROPHILS                                  3.2                           1.7 - 8.2 K/UL                  ABS. LYMPHOCYTES                                  1.6                           0.5 - 4.6 K/UL                  ABS. MONOCYTES                                    0.4                           0.1 - 1.3 K/UL                  ABS. EOSINOPHILS  0.1                           0.0 - 0.8 K/UL                  ABS. BASOPHILS                                    0.0                           0.0 - 0.2 K/UL                  ABS. IMM. GRANS.                                  0.0                           0.0 - 0.5 K/UL             -METABOLIC PANEL, BASIC       Result                                            Value                         Ref Range                       Sodium                                            142                           136 - 145 mmol/L                Potassium                                         4.3                           3.5 - 5.1 mmol/L                Chloride                                          108 (H)                       98 - 107 mmol/L                 CO2  26                            21 - 32 mmol/L                  Anion gap                                         8                             7 - 16 mmol/L                   Glucose                                           92                            65 - 100 mg/dL                  BUN                                               7                             6 - 23 MG/DL                    Creatinine                                        0.72                          0.6 - 1.0 MG/DL                 GFR est AA                                        >60                           >60 ml/min/1.46m2               GFR est non-AA                                    >60                           >60 ml/min/1.78m2                Calcium  8.8                           8.3 - 10.4 MG/DL           -D DIMER       Result                                            Value                         Ref Range                       D DIMER                                           1.45 (HH)                     <0.55 ug/ml(FEU)           )  Tests in the radiology section of CPT??: reviewed and ordered (Xr Knee Rt 3 V    Result Date: 02/22/2016  Right Knee INDICATION: Pain and swelling. COMPARISON: None TECHNIQUE: AP, lateral, oblique views of the right knee were obtained FINDINGS:  There is no acute fracture or dislocation. Joint spaces are preserved. Suggestion of small joint effusion.     IMPRESSION: No acute fracture or dislocation.     Duplex Lower Ext Venous Right    Result Date: 02/23/2016  Right lower extremity duplex venous doppler exam. INDICATION: pain and swelling. TECHNIQUE: Gray-scale ultrasound with and without compression and color Doppler evaluation were performed of the deep veins of the right lower extremity from the level of the right common femoral vein to the level of the right popliteal vein. Peroneal and posterior tibial veins also interrogated. FINDINGS: The right common femoral vein, right femoral vein, peroneal, posterior tibial, and right popliteal veins are compressible and opacify with color at color Doppler evaluation. There is no evidence of deep vein thrombosis.     IMPRESSION: Negative for DVT.     )  Tests in the medicine section of CPT??: ordered and reviewed      ED Course       Procedures

## 2016-02-23 ENCOUNTER — Inpatient Hospital Stay
Admit: 2016-02-23 | Discharge: 2016-02-23 | Disposition: A | Discharge status: Home / Self Care | Payer: Self-pay | Attending: Emergency Medicine

## 2016-02-23 LAB — CBC WITH AUTOMATED DIFF
ABS. BASOPHILS: 0 10*3/uL (ref 0.0–0.2)
ABS. EOSINOPHILS: 0.1 10*3/uL (ref 0.0–0.8)
ABS. IMM. GRANS.: 0 10*3/uL (ref 0.0–0.5)
ABS. LYMPHOCYTES: 1.6 10*3/uL (ref 0.5–4.6)
ABS. MONOCYTES: 0.4 10*3/uL (ref 0.1–1.3)
ABS. NEUTROPHILS: 3.2 10*3/uL (ref 1.7–8.2)
BASOPHILS: 0 % (ref 0.0–2.0)
EOSINOPHILS: 2 % (ref 0.5–7.8)
HCT: 40.3 % (ref 35.8–46.3)
HGB: 14.5 g/dL (ref 11.7–15.4)
IMMATURE GRANULOCYTES: 0 % (ref 0.0–5.0)
LYMPHOCYTES: 30 % (ref 13–44)
MCH: 31 PG (ref 26.1–32.9)
MCHC: 36 g/dL — ABNORMAL HIGH (ref 31.4–35.0)
MCV: 86.3 FL (ref 79.6–97.8)
MONOCYTES: 8 % (ref 4.0–12.0)
MPV: 10.6 FL — ABNORMAL LOW (ref 10.8–14.1)
NEUTROPHILS: 60 % (ref 43–78)
PLATELET: 237 10*3/uL (ref 150–450)
RBC: 4.67 M/uL (ref 4.05–5.25)
RDW: 13.4 % (ref 11.9–14.6)
WBC: 5.4 10*3/uL (ref 4.3–11.1)

## 2016-02-23 LAB — METABOLIC PANEL, BASIC
Anion gap: 8 mmol/L (ref 7–16)
BUN: 7 MG/DL (ref 6–23)
CO2: 26 mmol/L (ref 21–32)
Calcium: 8.8 MG/DL (ref 8.3–10.4)
Chloride: 108 mmol/L — ABNORMAL HIGH (ref 98–107)
Creatinine: 0.72 MG/DL (ref 0.6–1.0)
GFR est AA: >60 mL/min/{1.73_m2} (ref >60)
GFR est non-AA: >60 mL/min/{1.73_m2} (ref >60)
Glucose: 92 mg/dL (ref 65–100)
Potassium: 4.3 mmol/L (ref 3.5–5.1)
Sodium: 142 mmol/L (ref 136–145)

## 2016-02-23 LAB — EKG, 12 LEAD, INITIAL
Atrial Rate: 80 {beats}/min
Calculated P Axis: 67 degrees
Calculated R Axis: 78 degrees
Calculated T Axis: 7 degrees
P-R Interval: 124 ms
Q-T Interval: 378 ms
QRS Duration: 92 ms
QTC Calculation (Bezet): 435 ms
Ventricular Rate: 80 {beats}/min

## 2016-02-23 LAB — D DIMER: D DIMER: 1.45 ug/ml(FEU) — CR (ref <0.55)

## 2016-02-23 LAB — EKG 12-LEAD
Atrial Rate: 80 {beats}/min
P Axis: 67 degrees
P-R Interval: 124 ms
Q-T Interval: 378 ms
QRS Duration: 92 ms
QTc Calculation (Bazett): 435 ms
R Axis: 78 degrees
T Axis: 7 degrees
Ventricular Rate: 80 {beats}/min

## 2016-02-23 LAB — D-DIMER, QUANTITATIVE: D-Dimer, Quant: 1.45 ug/ml(FEU) (ref <0.55)

## 2016-02-23 MED ORDER — ALBUTEROL SULFATE HFA 90 MCG/ACTUATION AEROSOL INHALER
90 mcg/actuation | RESPIRATORY_TRACT | 4 refills | Status: AC | PRN
Start: 2016-02-23 — End: ?

## 2016-02-23 MED ORDER — MORPHINE 4 MG/ML SYRINGE
4 mg/mL | INTRAMUSCULAR | Status: AC
Start: 2016-02-23 — End: 2016-02-22
  Administered 2016-02-23: 03:00:00 via INTRAVENOUS

## 2016-02-23 MED ORDER — PREDNISONE 5 MG TABLETS IN A DOSE PACK
5 mg | ORAL_TABLET | ORAL | 0 refills | Status: DC
Start: 2016-02-23 — End: 2018-01-04

## 2016-02-23 MED ORDER — SODIUM CHLORIDE 0.9 % IJ SYRG
INTRAMUSCULAR | Status: DC | PRN
Start: 2016-02-23 — End: 2016-02-23

## 2016-02-23 MED ORDER — IPRATROPIUM-ALBUTEROL 2.5 MG-0.5 MG/3 ML NEB SOLUTION
2.5 mg-0.5 mg/3 ml | RESPIRATORY_TRACT | Status: AC
Start: 2016-02-23 — End: 2016-02-22
  Administered 2016-02-23: 03:00:00 via RESPIRATORY_TRACT

## 2016-02-23 MED ORDER — SODIUM CHLORIDE 0.9 % IJ SYRG
Freq: Three times a day (TID) | INTRAMUSCULAR | Status: DC
Start: 2016-02-23 — End: 2016-02-23

## 2016-02-23 MED FILL — IPRATROPIUM-ALBUTEROL 2.5 MG-0.5 MG/3 ML NEB SOLUTION: 2.5 mg-0.5 mg/3 ml | RESPIRATORY_TRACT | Qty: 3

## 2016-02-23 MED FILL — MORPHINE 4 MG/ML SYRINGE: 4 mg/mL | INTRAMUSCULAR | Qty: 1

## 2016-02-23 NOTE — ED Notes (Signed)
I have reviewed discharge instructions with the patient.  The patient verbalized understanding.  The patient is ambulatory upon exit and is in no acute distress at this time.  The patient has discharge instructions and prescriptions x2 in hand.  The patient does not have any questions at this time.

## 2016-02-23 NOTE — ED Notes (Signed)
Ace wrap applied to patient's right knee.  Patient tolerated procedure well.  Patient in no acute distress at this time.

## 2016-04-18 ENCOUNTER — Inpatient Hospital Stay
Admit: 2016-04-18 | Discharge: 2016-04-18 | Disposition: A | Discharge status: Home / Self Care | Payer: Self-pay | Attending: Student in an Organized Health Care Education/Training Program

## 2016-04-18 ENCOUNTER — Emergency Department: Admit: 2016-04-18 | Payer: Self-pay

## 2016-04-18 DIAGNOSIS — M545 Low back pain: Secondary | ICD-10-CM

## 2016-04-18 MED ORDER — KETOROLAC TROMETHAMINE 30 MG/ML INJECTION
30 mg/mL (1 mL) | INTRAMUSCULAR | Status: AC
Start: 2016-04-18 — End: 2016-04-18
  Administered 2016-04-18: 15:00:00 via INTRAMUSCULAR

## 2016-04-18 MED ORDER — TRAMADOL 50 MG TAB
50 mg | ORAL_TABLET | Freq: Four times a day (QID) | ORAL | 0 refills | Status: DC | PRN
Start: 2016-04-18 — End: 2018-01-04

## 2016-04-18 MED ORDER — CYCLOBENZAPRINE 10 MG TAB
10 mg | ORAL | Status: AC
Start: 2016-04-18 — End: 2016-04-18
  Administered 2016-04-18: 15:00:00 via ORAL

## 2016-04-18 MED FILL — KETOROLAC TROMETHAMINE 30 MG/ML INJECTION: 30 mg/mL (1 mL) | INTRAMUSCULAR | Qty: 1

## 2016-04-18 MED FILL — CYCLOBENZAPRINE 10 MG TAB: 10 mg | ORAL | Qty: 1

## 2016-04-18 NOTE — ED Triage Notes (Signed)
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 Pt states pain has become progressively worse since that time. Pain is in her lower back on both sides.

## 2016-04-18 NOTE — ED Provider Notes (Signed)
HPI Comments: 35 year old female presents with worsening lumbar spine pain.  Patient states her symptoms have been progressive for the past week.  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  Patient denies any specific injury but states her symptoms started after this increased workload.  Patient denies any radiation of her pain and describes an aching dull sensation at the lower lumbar spine.  She reports worsened symptoms with bending and twisting as well as lifting at home.  Patient denies any saddle anesthesia or loss of bowel or bladder control.  She denies any radicular symptoms in the lower extremities.  Patient denies any previous back surgeries or injuries in the past.  She reports little improvement at home with ibuprofen 800 mg, heat pads and ice to the area.  She is also tried Flexeril unsuccessfully.  Patient denies any fever associated with her symptoms, denies lower extremity weakness or numbness.    Patient is a 35 y.o. female presenting with back pain. The history is provided by the patient.   Back Pain    This is a new problem. The current episode started more than 2 days ago. The problem has not changed since onset.The problem occurs constantly. Patient reports not work related injury.The pain is associated with lifting and twisting. The pain is present in the lumbar spine. The pain does not radiate. The pain is at a severity of 7/10. The pain is moderate. The symptoms are aggravated by bending and twisting. Pertinent negatives include no chest pain, no fever, no numbness, no weight loss, no headaches, no abdominal pain, no abdominal swelling, no bowel incontinence, no perianal numbness, no bladder incontinence, no dysuria, no pelvic pain, no leg pain, no paresthesias, no paresis, no tingling and no weakness. She has tried NSAIDs, muscle relaxants, heat and ice for the symptoms. The treatment provided no relief.        Past Medical History:    Diagnosis Date   ??? Asthma    ??? Hypertension        No past surgical history on file.      No family history on file.    Social History     Social History   ??? Marital status: SINGLE     Spouse name: N/A   ??? Number of children: N/A   ??? Years of education: N/A     Occupational History   ??? Not on file.     Social History Main Topics   ??? Smoking status: Current Every Day Smoker   ??? Smokeless tobacco: Not on file   ??? Alcohol use No   ??? Drug use: Yes     Special: Marijuana   ??? Sexual activity: Not on file     Other Topics Concern   ??? Not on file     Social History Narrative         ALLERGIES: Bees [hymenoptera allergenic extract]    Review of Systems   Constitutional: Negative for chills, diaphoresis, fever and weight loss.   HENT: Negative for congestion, sneezing and sore throat.    Eyes: Negative for visual disturbance.   Respiratory: Negative for cough, chest tightness, shortness of breath and wheezing.    Cardiovascular: Negative for chest pain and leg swelling.   Gastrointestinal: Negative for abdominal pain, blood in stool, bowel incontinence, diarrhea, nausea and vomiting.   Endocrine: Negative for polyuria.   Genitourinary: Negative for bladder incontinence, difficulty urinating, dysuria, flank pain, hematuria, pelvic  pain and urgency.   Musculoskeletal: Positive for back pain. Negative for myalgias, neck pain and neck stiffness.   Skin: Negative for color change and rash.   Neurological: Negative for dizziness, tingling, syncope, speech difficulty, weakness, light-headedness, numbness, headaches and paresthesias.   Psychiatric/Behavioral: Negative for behavioral problems.   All other systems reviewed and are negative.      Vitals:    04/18/16 0838   BP: 116/74   Pulse: 96   Resp: 20   Temp: 98.2 ??F (36.8 ??C)   SpO2: 97%   Weight: 75.8 kg (167 lb)   Height: 5\' 2"  (1.575 m)            Physical Exam   Constitutional: She is oriented to person, place, and time. She appears  well-developed and well-nourished. No distress.   Alert and oriented to person, place and time. No acute distress. Speaks in clear, fluent sentences.      HENT:   Head: Normocephalic and atraumatic.   Nose: Nose normal.   Eyes: Conjunctivae and EOM are normal. Pupils are equal, round, and reactive to light.   Neck: Normal range of motion. Neck supple. No JVD present. No tracheal deviation present.   Cardiovascular: Normal rate, regular rhythm, S1 normal, S2 normal, normal heart sounds and intact distal pulses.  Exam reveals no gallop, no distant heart sounds and no friction rub.    No murmur heard.  Pulmonary/Chest: No accessory muscle usage or stridor. No tachypnea and no bradypnea. No respiratory distress.   Abdominal: Normal appearance.   Musculoskeletal: Normal range of motion. She exhibits no edema, tenderness or deformity.   Neurological: She is alert and oriented to person, place, and time. No cranial nerve deficit.   Skin: Skin is warm and dry. No rash noted. She is not diaphoretic.   Psychiatric: She has a normal mood and affect. Her behavior is normal.   Nursing note and vitals reviewed.       MDM  Number of Diagnoses or Management Options  Acute midline low back pain without sciatica: new and requires workup  Diagnosis management comments: Lumbar spine x-ray imaging shows no acute abnormality visible.  Patient denies any radicular symptoms, loss of bowel or bladder control or  Saddle anesthesia.  She denies fever with her symptoms.  Likely secondary to recent increase in workload, moving heavy furniture.  Suspect muscle strain as cause of patient's symptoms.  We will treat symptomatically and encourage continued anti-inflammatory use.  Patient's been referred to primary care for follow-up.       Amount and/or Complexity of Data Reviewed  Tests in the radiology section of CPT??: ordered and reviewed  Independent visualization of images, tracings, or specimens: yes     Risk of Complications, Morbidity, and/or Mortality  Presenting problems: moderate  Diagnostic procedures: low  Management options: moderate    Patient Progress  Patient progress: stable    ED Course       Procedures

## 2016-04-18 NOTE — ED Notes (Signed)
I have reviewed discharge instructions with the patient.  The patient verbalized understanding.    Patient left ED via Discharge Method: ambulatory to Home with self.    Opportunity for questions and clarification provided.       Patient given 1 scripts.

## 2017-04-25 IMAGING — CR DG FOOT COMPLETE 3+V*R*
3 series · 3 of 3 positions shown · non-contrast
Comparison: None.

CLINICAL DATA: 34-year-old female with right foot pain and swelling

EXAM:
RIGHT FOOT COMPLETE - 3+ VIEW

[x foot ap right]
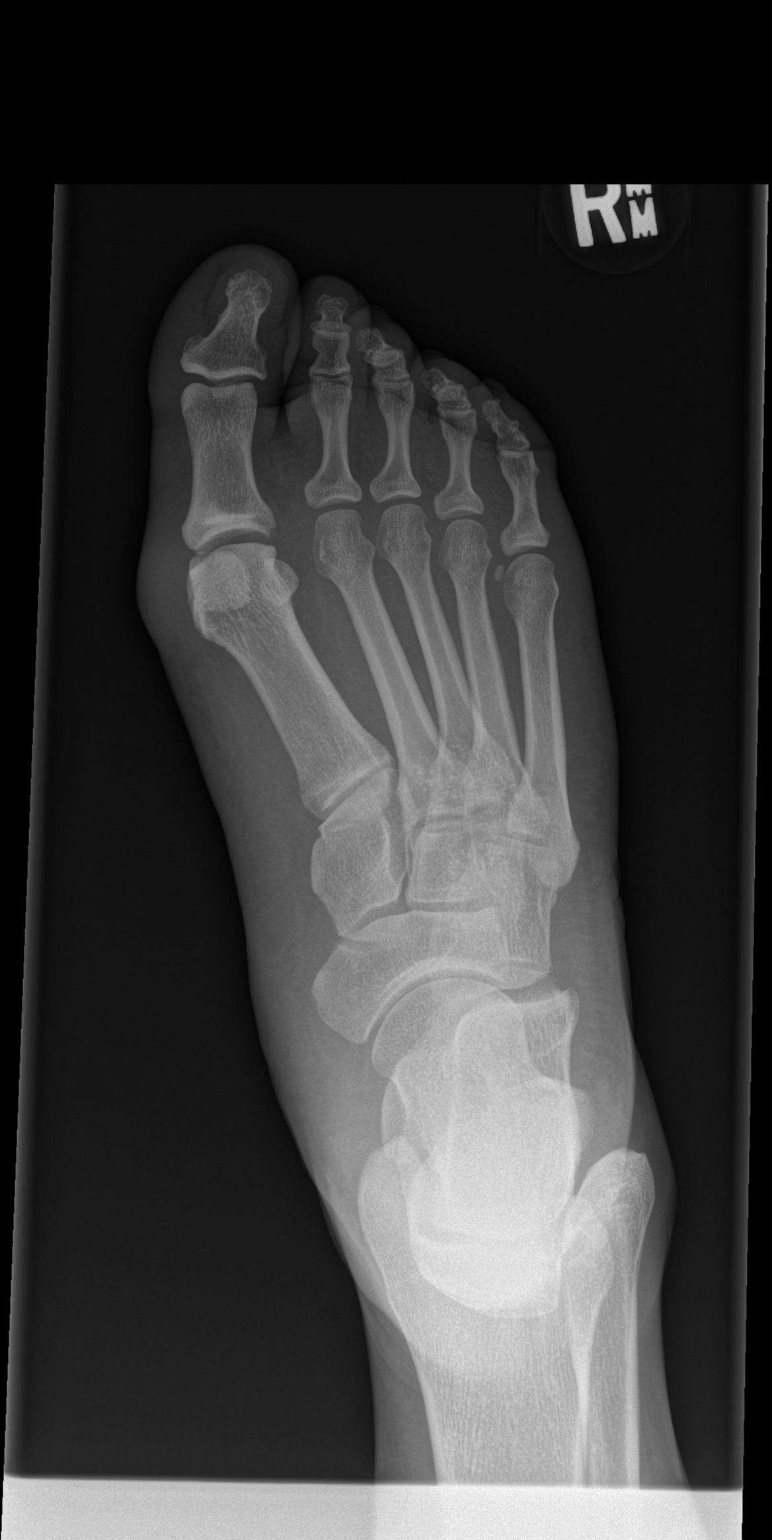

[x foot obl right]
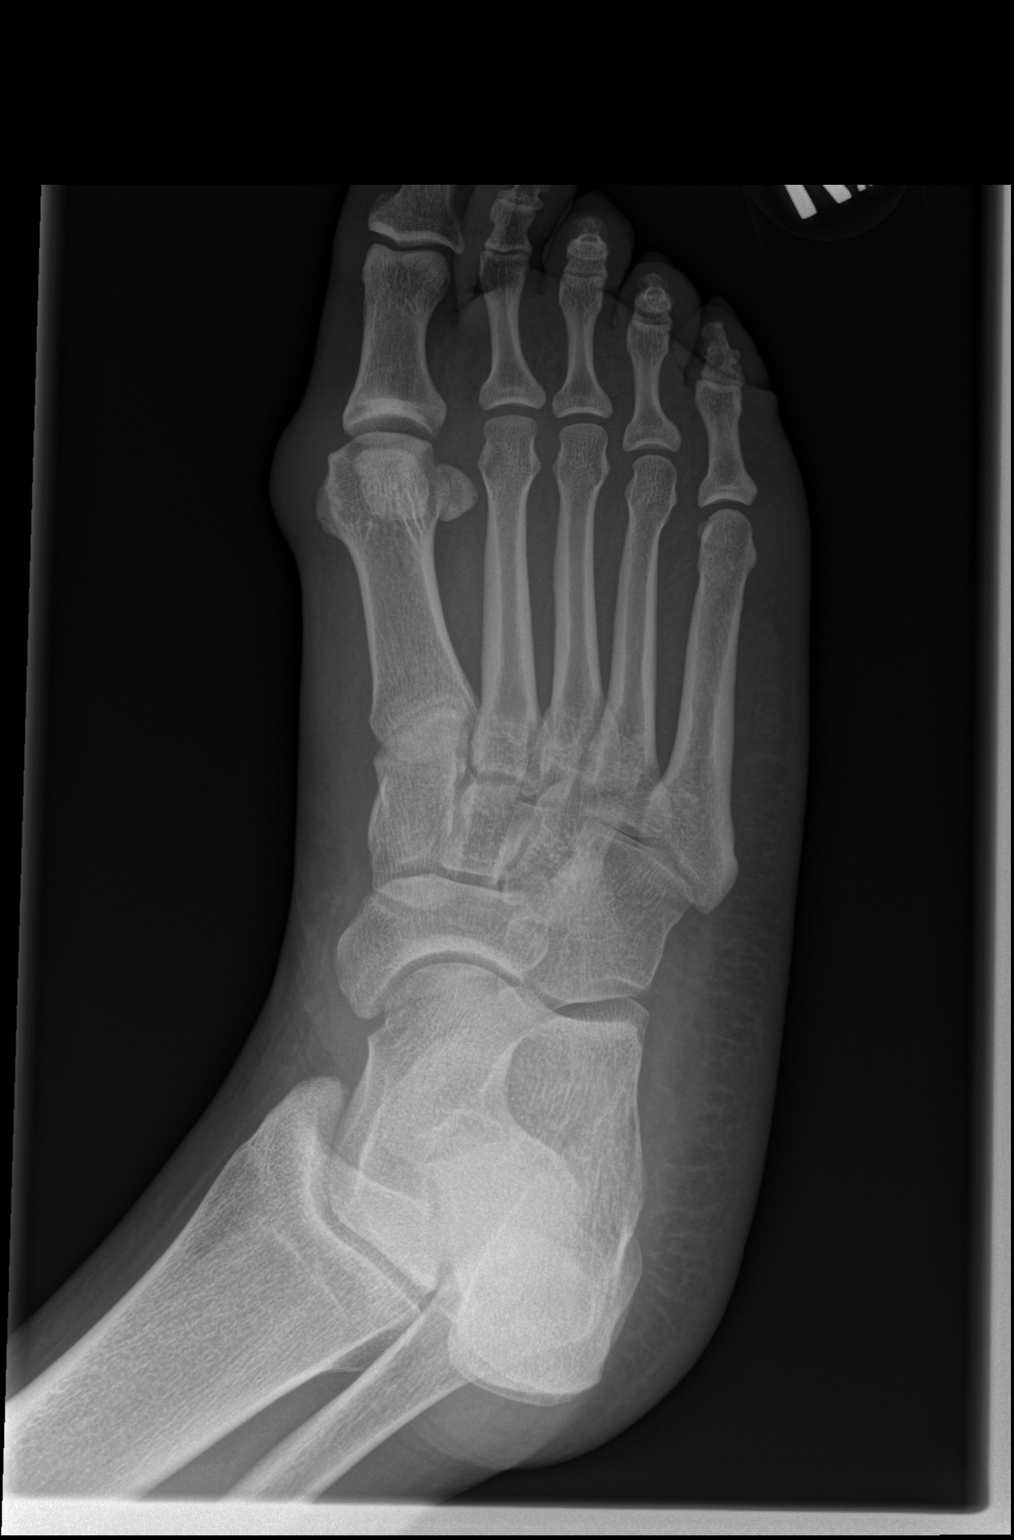

[x foot lat right]
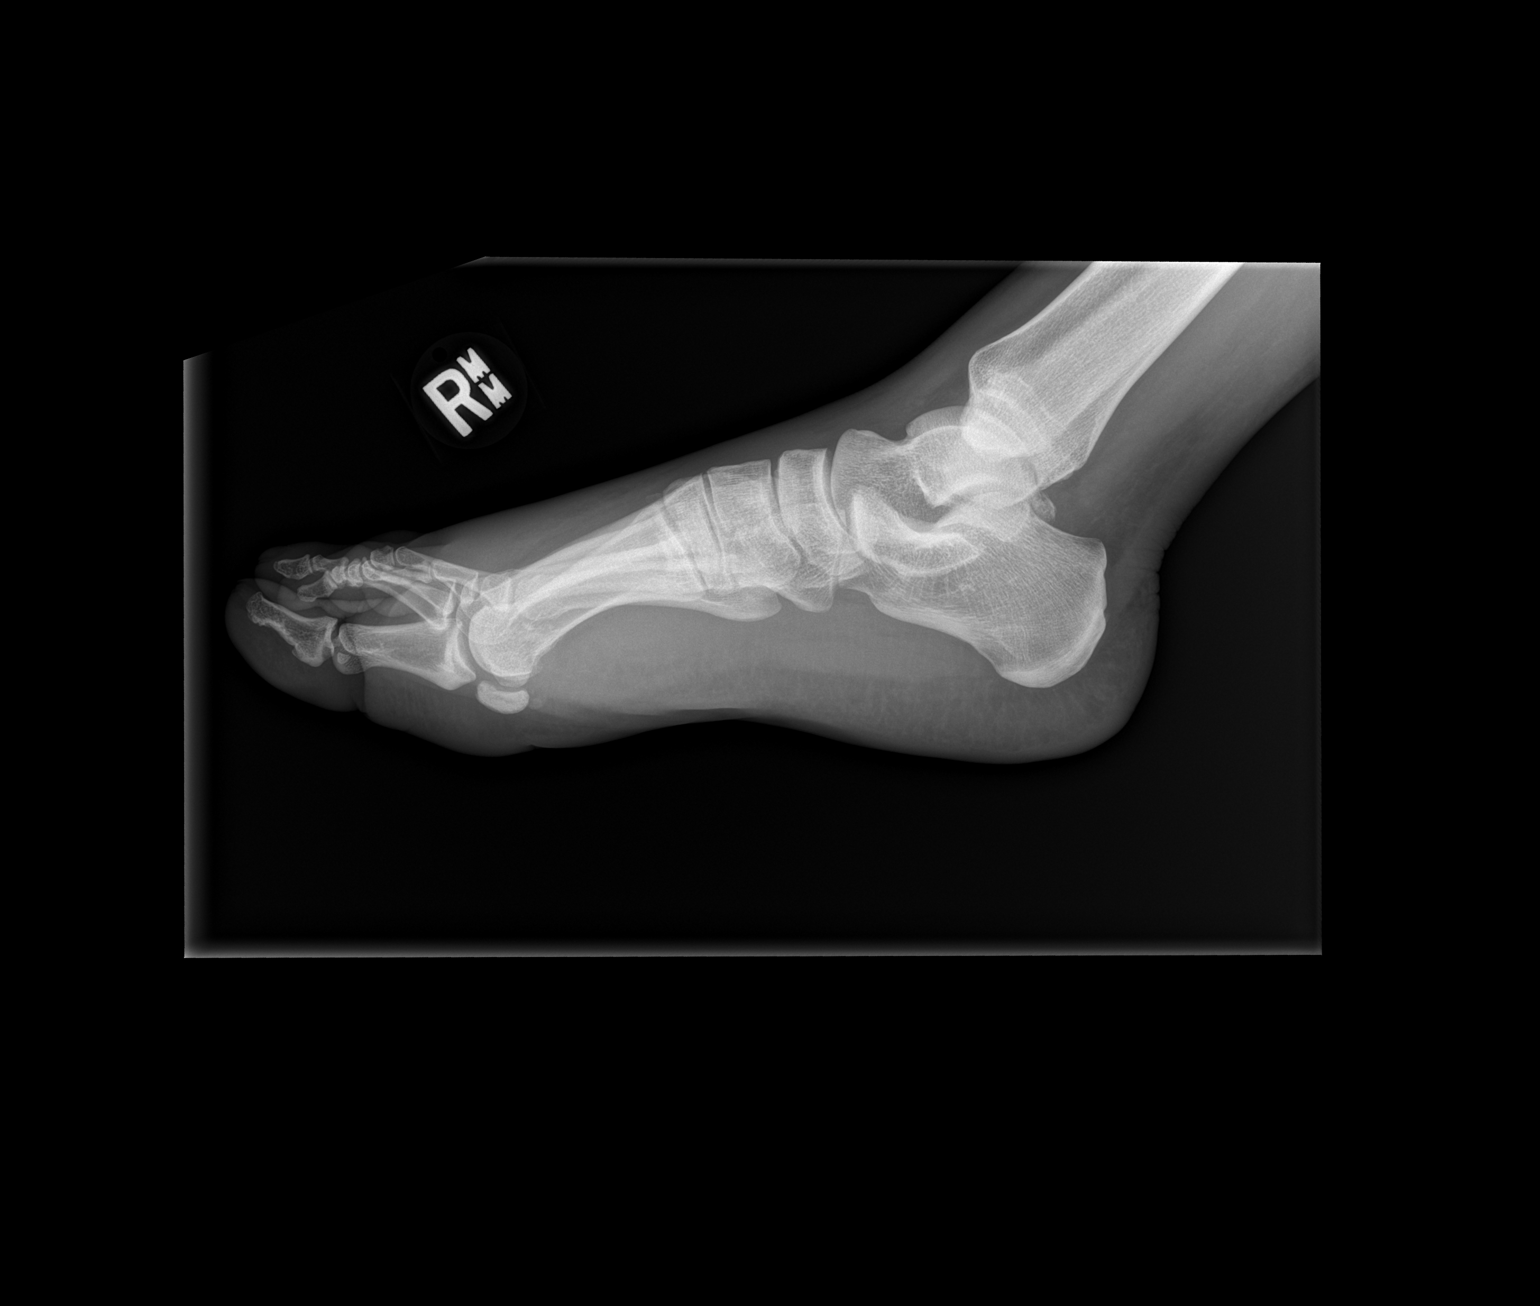

[3 of 3 positions shown; findings below may reference images not displayed]

FINDINGS: There is focal irregularity and angulation of the medial cortex of
the cuboid bone concerning for a fracture. No other fracture
identified. There is diffuse soft tissue swelling of the foot. No
radiopaque foreign object identified.
IMPRESSION: Nondisplaced cuboid fracture.

## 2018-01-04 ENCOUNTER — Inpatient Hospital Stay
Admit: 2018-01-04 | Discharge: 2018-01-04 | Disposition: A | Discharge status: Home / Self Care | Payer: Self-pay | Attending: Emergency Medicine

## 2018-01-04 DIAGNOSIS — L02811 Cutaneous abscess of head [any part, except face]: Secondary | ICD-10-CM

## 2018-01-04 MED ORDER — CLINDAMYCIN 150 MG CAP
150 mg | ORAL_CAPSULE | Freq: Four times a day (QID) | ORAL | 0 refills | Status: AC
Start: 2018-01-04 — End: 2018-01-11

## 2018-01-04 NOTE — ED Provider Notes (Signed)
ED Provider Notes by Lucina MellowEdwards, Kirtan Sada Ann, NP at 01/04/18 1206                Author: Lucina MellowEdwards, Trevin Gartrell Ann, NP  Service: --  Author Type: Nurse Practitioner       Filed: 01/04/18 1241  Date of Service: 01/04/18 1206  Status: Attested Addendum          Editor: Lucina MellowEdwards, Deondray Ospina Ann, NP (Nurse Practitioner)       Related Notes: Original Note by Paulita FujitaBlankenship, Jay F, MD (Physician) filed at 01/04/18 1207          Cosigner: Natale MilchHorn, Kevin D, DO at 01/04/18 1649            Procedure Orders        1. I&D Abcess Simple [[id number]] ordered by Lucina MellowEdwards, Anndrea Mihelich Ann, NP                         Attestation signed by Natale MilchHorn, Kevin D, DO at 01/04/18 1649          Attending Note - ATTENDING ATTESTATION:   Patient independently seen by Midlevel provider.  I did not personally see the patient.  I was available for discussion and evaluation but NO discussion about the patient was taken on this encounter.    _                                      [facility]   Arrival Date/Time: No admission date for patient encounter.             Stacy Zamora    MRN: [id number]      Date of Birth: 09/22/1980     37 y.o. female          [facility] EMERGENCY DEPT Room/bed info not found    Seen on 01/04/2018 @ 12:06 PM           [facility] Provider NOTE:  Presenting with  3 days of swelling on L posterior scalp   Paulita FujitaJay F Blankenship, MD; 01/04/2018  @12 :06 PM============================        Stacy Zamora is a 37 y.o.  female seen on 01/04/2018 at 12:06 PM [facility]         Chief Complaint       Patient presents with        ?  Cyst        37 year old female presents for evaluation of swollen area to the back of her scalp.  She reports has been there for 4 days.  She  has had no fever chills nausea vomiting or diarrhea.  No drainage.  Area is tender erythematous and swollen.                 Historian:         REVIEW OF SYSTEMS        Review of Systems    Constitutional: Negative for chills and fever.    HENT:  Negative for nosebleeds and rhinorrhea.     Eyes: Negative for discharge and redness.    Respiratory: Negative for cough and shortness of breath.     Cardiovascular: Negative for chest pain and palpitations.    Gastrointestinal: Negative for nausea and vomiting.    Genitourinary: Negative for difficulty urinating and dysuria.    Musculoskeletal: Negative for back  pain and myalgias.    Skin: Positive for wound. Negative for color change.    Neurological: Negative for weakness and headaches.    Psychiatric/Behavioral: Negative for confusion and decreased concentration.            PAST MEDICAL HISTORY          Past Medical History:        Diagnosis  Date         ?  Asthma           ?  Hypertension          History reviewed. No pertinent surgical history.     Social History          Socioeconomic History         ?  Marital status:  SINGLE              Spouse name:  Not on file         ?  Number of children:  Not on file     ?  Years of education:  Not on file     ?  Highest education level:  Not on file       Tobacco Use         ?  Smoking status:  Current Every Day Smoker       Substance and Sexual Activity         ?  Alcohol use:  No     ?  Drug use:  Yes              Types:  Marijuana          Cannot display prior to admission medications because the patient has not been admitted in this contact.        Allergies      Allergen  Reactions      ?  Bees [Hymenoptera Allergenic Extract]  Swelling                  PHYSICAL EXAM            Vitals:          01/04/18 1205        BP:  120/75     Pulse:  93     Resp:  16     Temp:  98.1 ??F (36.7 ??C)        SpO2:  100%      Vital signs were reviewed.       Physical Exam    Constitutional: She is oriented to person, place, and time. She appears well-developed and well-nourished.    HENT:    Head: Atraumatic.         Right Ear: External ear normal.   Left Ear: External ear normal.    Eyes: Pupils are equal, round, and reactive to light. EOM are normal.    Neck: Normal range of  motion. Neck supple.    Cardiovascular: Normal rate and regular rhythm.    Pulmonary/Chest: Effort normal and breath sounds normal.   Musculoskeletal: Normal range of motion.   Neurological: She is alert and oriented to person, place, and time.    Skin: Skin is warm and dry. Capillary refill takes less than 2 seconds. She is not diaphoretic.   Psychiatric: She has a normal  mood and affect. Her behavior is normal. Judgment and thought content normal.    Nursing note and vitals reviewed.  MEDICAL DECISION MAKING        Differential Diagnosis:       MDM   Number of Diagnoses or Management Options   Diagnosis management comments: Tolerated procedure well.  Will discharge home with antibiotics to follow up with her family physician      Risk of Complications, Morbidity, and/or Mortality   Presenting problems: minimal  Diagnostic procedures: low  Management options: minimal     Patient Progress   Patient progress: stable      I&D Abcess Simple  Date/Time: 01/04/2018 12:40 PM   Performed by:  Lucina Mellow, NP   Authorized by:  Lucina Mellow, NP       Consent:      Consent obtained:  Verbal     Consent given by:  Patient     Risks discussed:  Bleeding, incomplete drainage, pain, infection and damage to other organs     Alternatives discussed:  No treatment   Location:      Type:  Abscess     Location:  Head     Head location:  Scalp   Pre-procedure details:      Skin preparation:  Betadine   Anesthesia (see MAR for exact dosages):      Anesthesia method:  Local infiltration     Local anesthetic:  Lidocaine 1% w/o epi   Procedure type:      Complexity:  Simple   Procedure details:     Needle aspiration: no       Incision types:  Single straight     Incision depth:  Subcutaneous     Scalpel blade:  11     Wound management:  Probed and deloculated, irrigated with saline and extensive cleaning     Drainage:  Purulent and bloody     Drainage amount:  Moderate     Wound treatment:  Wound left open      Packing materials:  None   Post-procedure details:      Patient tolerance of procedure:  Tolerated well, no immediate complications            ED Course:        Disposition:     Diagnosis:

## 2018-01-04 NOTE — ED Notes (Signed)
Pt reports knot on back of head. Pt states she thinks she was bit by an insect. Unsure how long it has been there for. No drainage. Tender to touch

## 2018-01-04 NOTE — ED Notes (Signed)
I have reviewed discharge instructions with the patient.  The patient verbalized understanding.    Patient left ED via Discharge Method: ambulatory to Home with (insert name of family/friend, self, transportSelf).    Opportunity for questions and clarification provided.       Patient given 1 scripts.         To continue your aftercare when you leave the hospital, you may receive an automated call from our care team to check in on how you are doing.  This is a free service and part of our promise to provide the best care and service to meet your aftercare needs." If you have questions, or wish to unsubscribe from this service please call [phone].  Thank you for Choosing our [facility] Emergency Department.

## 2018-01-04 NOTE — ED Provider Notes (Addendum)
[facility] Emergency Department  Arrival Date/Time: No admission date for patient encounter.      Stacy Zamora  MRN: [id number]    Date of Birth: 09/11/1980   37 y.o. female    [facility] EMERGENCY DEPT Room/bed info not found  Seen on 01/04/2018 @ 12:06 PM    [facility] Provider NOTE:  Presenting with 3 days of swelling on L posterior scalp  Paulita Fujita, MD; 01/04/2018 @12 :06 PM============================   Stacy Zamora is a 37 y.o. female seen on 01/04/2018 at 12:06 PM in the [facility] EMERGENCY DEPT     Chief Complaint   Patient presents with   ??? Cyst   37 year old female presents for evaluation of swollen area to the back of her scalp.  She reports has been there for 4 days.  She has had no fever chills nausea vomiting or diarrhea.  No drainage.  Area is tender erythematous and swollen.            Historian:     REVIEW OF SYSTEMS     Review of Systems   Constitutional: Negative for chills and fever.   HENT: Negative for nosebleeds and rhinorrhea.    Eyes: Negative for discharge and redness.   Respiratory: Negative for cough and shortness of breath.    Cardiovascular: Negative for chest pain and palpitations.   Gastrointestinal: Negative for nausea and vomiting.   Genitourinary: Negative for difficulty urinating and dysuria.   Musculoskeletal: Negative for back pain and myalgias.   Skin: Positive for wound. Negative for color change.   Neurological: Negative for weakness and headaches.   Psychiatric/Behavioral: Negative for confusion and decreased concentration.       PAST MEDICAL HISTORY     Past Medical History:   Diagnosis Date   ??? Asthma    ??? Hypertension      History reviewed. No pertinent surgical history.  Social History     Socioeconomic History   ??? Marital status: SINGLE     Spouse name: Not on file   ??? Number of children: Not on file   ??? Years of education: Not on file   ??? Highest education level: Not on file   Tobacco Use   ??? Smoking status: Current Every Day Smoker    Substance and Sexual Activity   ??? Alcohol use: No   ??? Drug use: Yes     Types: Marijuana     Cannot display prior to admission medications because the patient has not been admitted in this contact.     Allergies   Allergen Reactions   ??? Bees [Hymenoptera Allergenic Extract] Swelling        PHYSICAL EXAM       Vitals:    01/04/18 1205   BP: 120/75   Pulse: 93   Resp: 16   Temp: 98.1 ??F (36.7 ??C)   SpO2: 100%    Vital signs were reviewed.     Physical Exam   Constitutional: She is oriented to person, place, and time. She appears well-developed and well-nourished.   HENT:   Head: Atraumatic.       Right Ear: External ear normal.   Left Ear: External ear normal.   Eyes: Pupils are equal, round, and reactive to light. EOM are normal.   Neck: Normal range of motion. Neck supple.   Cardiovascular: Normal rate and regular rhythm.   Pulmonary/Chest: Effort normal and breath sounds normal.   Musculoskeletal: Normal range of motion.   Neurological: She is  alert and oriented to person, place, and time.   Skin: Skin is warm and dry. Capillary refill takes less than 2 seconds. She is not diaphoretic.   Psychiatric: She has a normal mood and affect. Her behavior is normal. Judgment and thought content normal.   Nursing note and vitals reviewed.       MEDICAL DECISION MAKING     Differential Diagnosis:     MDM  Number of Diagnoses or Management Options  Diagnosis management comments: Tolerated procedure well.  Will discharge home with antibiotics to follow up with her family physician    Risk of Complications, Morbidity, and/or Mortality  Presenting problems: minimal  Diagnostic procedures: low  Management options: minimal    Patient Progress  Patient progress: stable    I&D Abcess Simple  Date/Time: 01/04/2018 12:40 PM  Performed by: Lucina MellowEdwards, Alper Guilmette Ann, NP  Authorized by: Lucina MellowEdwards, Camrie Stock Ann, NP     Consent:     Consent obtained:  Verbal    Consent given by:  Patient     Risks discussed:  Bleeding, incomplete drainage, pain, infection and damage to other organs    Alternatives discussed:  No treatment  Location:     Type:  Abscess    Location:  Head    Head location:  Scalp  Pre-procedure details:     Skin preparation:  Betadine  Anesthesia (see MAR for exact dosages):     Anesthesia method:  Local infiltration    Local anesthetic:  Lidocaine 1% w/o epi  Procedure type:     Complexity:  Simple  Procedure details:     Needle aspiration: no      Incision types:  Single straight    Incision depth:  Subcutaneous    Scalpel blade:  11    Wound management:  Probed and deloculated, irrigated with saline and extensive cleaning    Drainage:  Purulent and bloody    Drainage amount:  Moderate    Wound treatment:  Wound left open    Packing materials:  None  Post-procedure details:     Patient tolerance of procedure:  Tolerated well, no immediate complications        ED Course:      Disposition:    Diagnosis:

## 2018-01-04 NOTE — ED Notes (Signed)
I have reviewed discharge instructions with the patient.  The patient verbalized understanding.    Patient left ED via Discharge Method: ambulatory to Home with (insert name of family/friend, self, transportSelf).    Opportunity for questions and clarification provided.       Patient given 1 scripts.         To continue your aftercare when you leave the hospital, you may receive an automated call from our care team to check in on how you are doing.  This is a free service and part of our promise to provide the best care and service to meet your aftercare needs.??? If you have questions, or wish to unsubscribe from this service please call [phone].  Thank you for Choosing our [facility].

## 2018-01-04 NOTE — ED Triage Notes (Addendum)
Pt reports knot on back of head. Pt states she thinks she was bit by an insect. Unsure how long it has been there for. No drainage. Tender to touch

## 2018-02-19 ENCOUNTER — Inpatient Hospital Stay
Admit: 2018-02-19 | Discharge: 2018-02-19 | Disposition: A | Discharge status: Home / Self Care | Payer: Self-pay | Attending: Emergency Medicine

## 2018-02-19 DIAGNOSIS — L03116 Cellulitis of left lower limb: Secondary | ICD-10-CM

## 2018-02-19 MED ORDER — TRIMETHOPRIM-SULFAMETHOXAZOLE 160 MG-800 MG TAB
160-800 mg | ORAL_TABLET | Freq: Two times a day (BID) | ORAL | 0 refills | Status: AC
Start: 2018-02-19 — End: 2018-03-01

## 2018-02-19 MED ORDER — HYDROCODONE-ACETAMINOPHEN 5 MG-325 MG TAB
5-325 mg | ORAL_TABLET | ORAL | 0 refills | Status: AC | PRN
Start: 2018-02-19 — End: 2018-02-22

## 2018-02-19 NOTE — ED Provider Notes (Signed)
ED Provider Notes by Dani GobbleBourdon, Kyron Schlitt A, MD at 02/19/18 1817                Author: Dani GobbleBourdon, Lakshmi Sundeen A, MD  Service: --  Author Type: Physician       Filed: 02/19/18 1909  Date of Service: 02/19/18 1817  Status: Signed          Editor: Dani GobbleBourdon, Delshon Blanchfield A, MD (Physician)               Per nurse's notes: "Pt walks into triage with c/o of possible insect bite. Pt has a spot on the left leg that resembles  a blister. Pt states pain and bump started yesterday while she was laying in bed. There is redness and swelling around the area."      The history is provided by the patient.    Insect Bite    This is a new problem. The  current episode started 2 days ago. The problem occurs  constantly. The problem has been gradually worsening. Pertinent negatives include no chest pain, no abdominal pain, no headaches  and no shortness of breath. The symptoms are aggravated by walking (Palpation). Nothing relieves the symptoms. She has tried nothing for the symptoms. The  treatment provided no relief.             Past Medical History:        Diagnosis  Date         ?  Asthma           ?  Hypertension             No past surgical history on file.        No family history on file.        Social History          Socioeconomic History         ?  Marital status:  SINGLE              Spouse name:  Not on file         ?  Number of children:  Not on file     ?  Years of education:  Not on file     ?  Highest education level:  Not on file       Occupational History        ?  Not on file       Social Needs         ?  Financial resource strain:  Not on file        ?  Food insecurity:              Worry:  Not on file         Inability:  Not on file        ?  Transportation needs:              Medical:  Not on file         Non-medical:  Not on file       Tobacco Use         ?  Smoking status:  Current Every Day Smoker       Substance and Sexual Activity         ?  Alcohol use:  No     ?  Drug use:  Yes              Types:  Marijuana         ?  Sexual activity:  Not on file       Lifestyle        ?  Physical activity:              Days per week:  Not on file         Minutes per session:  Not on file         ?  Stress:  Not on file       Relationships        ?  Social connections:              Talks on phone:  Not on file         Gets together:  Not on file         Attends religious service:  Not on file         Active member of club or organization:  Not on file         Attends meetings of clubs or organizations:  Not on file         Relationship status:  Not on file        ?  Intimate partner violence:              Fear of current or ex partner:  Not on file         Emotionally abused:  Not on file         Physically abused:  Not on file         Forced sexual activity:  Not on file        Other Topics  Concern        ?  Not on file       Social History Narrative        ?  Not on file              ALLERGIES: Bees [hymenoptera allergenic extract]      Review of Systems    Constitutional: Negative for chills and fever.    Respiratory: Negative for shortness of breath.     Cardiovascular: Negative for chest pain.    Gastrointestinal: Negative for abdominal pain, nausea and vomiting.    Skin: Positive for color change.    Neurological: Negative for headaches.    All other systems reviewed and are negative.           Vitals:          02/19/18 1721        BP:  137/79     Pulse:  (!) 114     Resp:  16     Temp:  98.2 ??F (36.8 ??C)     SpO2:  100%     Weight:  82.6 kg (182 lb)        Height:  5\' 2"  (1.575 m)                Physical Exam    Constitutional: She is oriented to person, place, and time. She appears well-developed and well-nourished. She appears distressed  (Mild).    HENT:    Head: Normocephalic and atraumatic.   Right Ear: External ear normal.   Left Ear: External ear normal.    Eyes: Pupils are equal, round, and reactive to light. EOM are normal.    Neck: Normal range of motion.    Cardiovascular: Normal rate and normal heart sounds.    No murmur  heard.   Pulmonary/Chest: Effort normal and breath  sounds normal.   Musculoskeletal: Normal range of motion.   Neurological: She is alert and oriented to person, place, and time.    Skin: Skin is warm and dry. No rash noted. She is not diaphoretic. There is erythema.           Nursing note and vitals reviewed.          MDM   Number of Diagnoses or Management Options   Cellulitis and abscess of leg, except foot: new and does not require workup       Amount and/or Complexity of Data Reviewed   Review  and summarize past medical records: yes      Risk of Complications, Morbidity, and/or Mortality   Presenting problems: low  Diagnostic procedures: minimal  Management options: low     Patient Progress   Patient progress: stable             Procedures

## 2018-02-19 NOTE — ED Notes (Signed)
Pt walks into triage with c/o of possible insect bite. Pt has a spot on the left leg that resembles a blister. Pt states pain and bump started yesterday while she was laying in bed. There is redness and swelling around the area.

## 2018-02-19 NOTE — ED Notes (Signed)

## 2018-02-19 NOTE — ED Triage Notes (Addendum)
Pt walks into triage with c/o of possible insect bite. Pt has a spot on the left leg that resembles a blister. Pt states pain and bump started yesterday while she was laying in bed. There is redness and swelling around the area.

## 2018-02-19 NOTE — ED Provider Notes (Signed)
Per nurse's notes: "Pt walks into triage with c/o of possible insect bite. Pt has a spot on the left leg that resembles a blister. Pt states pain and bump started yesterday while she was laying in bed. There is redness and swelling around the area."    The history is provided by the patient.   Insect Bite   This is a new problem. The current episode started 2 days ago. The problem occurs constantly. The problem has been gradually worsening. Pertinent negatives include no chest pain, no abdominal pain, no headaches and no shortness of breath. The symptoms are aggravated by walking (Palpation). Nothing relieves the symptoms. She has tried nothing for the symptoms. The treatment provided no relief.        Past Medical History:   Diagnosis Date   ??? Asthma    ??? Hypertension        No past surgical history on file.      No family history on file.    Social History     Socioeconomic History   ??? Marital status: SINGLE     Spouse name: Not on file   ??? Number of children: Not on file   ??? Years of education: Not on file   ??? Highest education level: Not on file   Occupational History   ??? Not on file   Social Needs   ??? Financial resource strain: Not on file   ??? Food insecurity:     Worry: Not on file     Inability: Not on file   ??? Transportation needs:     Medical: Not on file     Non-medical: Not on file   Tobacco Use   ??? Smoking status: Current Every Day Smoker   Substance and Sexual Activity   ??? Alcohol use: No   ??? Drug use: Yes     Types: Marijuana   ??? Sexual activity: Not on file   Lifestyle   ??? Physical activity:     Days per week: Not on file     Minutes per session: Not on file   ??? Stress: Not on file   Relationships   ??? Social connections:     Talks on phone: Not on file     Gets together: Not on file     Attends religious service: Not on file     Active member of club or organization: Not on file     Attends meetings of clubs or organizations: Not on file     Relationship status: Not on file    ??? Intimate partner violence:     Fear of current or ex partner: Not on file     Emotionally abused: Not on file     Physically abused: Not on file     Forced sexual activity: Not on file   Other Topics Concern   ??? Not on file   Social History Narrative   ??? Not on file         ALLERGIES: Bees [hymenoptera allergenic extract]    Review of Systems   Constitutional: Negative for chills and fever.   Respiratory: Negative for shortness of breath.    Cardiovascular: Negative for chest pain.   Gastrointestinal: Negative for abdominal pain, nausea and vomiting.   Skin: Positive for color change.   Neurological: Negative for headaches.   All other systems reviewed and are negative.      Vitals:    02/19/18 1721   BP: 137/79  Pulse: (!) 114   Resp: 16   Temp: 98.2 ??F (36.8 ??C)   SpO2: 100%   Weight: 82.6 kg (182 lb)   Height: 5\' 2"  (1.575 m)            Physical Exam   Constitutional: She is oriented to person, place, and time. She appears well-developed and well-nourished. She appears distressed (Mild).   HENT:   Head: Normocephalic and atraumatic.   Right Ear: External ear normal.   Left Ear: External ear normal.   Eyes: Pupils are equal, round, and reactive to light. EOM are normal.   Neck: Normal range of motion.   Cardiovascular: Normal rate and normal heart sounds.   No murmur heard.  Pulmonary/Chest: Effort normal and breath sounds normal.   Musculoskeletal: Normal range of motion.   Neurological: She is alert and oriented to person, place, and time.   Skin: Skin is warm and dry. No rash noted. She is not diaphoretic. There is erythema.        Nursing note and vitals reviewed.       MDM  Number of Diagnoses or Management Options  Cellulitis and abscess of leg, except foot: new and does not require workup     Amount and/or Complexity of Data Reviewed  Review and summarize past medical records: yes    Risk of Complications, Morbidity, and/or Mortality  Presenting problems: low  Diagnostic procedures: minimal   Management options: low    Patient Progress  Patient progress: stable         Procedures

## 2018-02-19 NOTE — ED Notes (Signed)
I have reviewed discharge instructions with the patient.  The patient verbalized understanding.    Patient left ED via Discharge Method: ambulatory to Home with (self    Opportunity for questions and clarification provided.       Patient given 2 scripts.         To continue your aftercare when you leave the hospital, you may receive an automated call from our care team to check in on how you are doing.  This is a free service and part of our promise to provide the best care and service to meet your aftercare needs.??? If you have questions, or wish to unsubscribe from this service please call [phone].  Thank you for Choosing our [facility].

## 2020-08-02 LAB — HEMOGLOBIN A1C
Estimated Avg Glucose, External: 114 mg/dL
Hemoglobin A1C, External: 5.6 % (ref <5.7)

## 2020-10-29 ENCOUNTER — Emergency Department: Admit: 2020-10-29

## 2020-10-29 ENCOUNTER — Inpatient Hospital Stay
Admit: 2020-10-29 | Discharge: 2020-10-29 | Disposition: A | Discharge status: Home / Self Care | Attending: Emergency Medicine

## 2020-10-29 DIAGNOSIS — R0789 Other chest pain: Secondary | ICD-10-CM

## 2020-10-29 LAB — EKG, 12 LEAD, INITIAL
Atrial Rate: 94 {beats}/min
Calculated P Axis: 84 degrees
Calculated R Axis: 87 degrees
Calculated T Axis: 13 degrees
P-R Interval: 129 ms
Q-T Interval: 367 ms
QRS Duration: 91 ms
QTC Calculation (Bezet): 459 ms
Ventricular Rate: 94 {beats}/min

## 2020-10-29 LAB — METABOLIC PANEL, COMPREHENSIVE
A-G Ratio: 0.7 — ABNORMAL LOW (ref 1.2–3.5)
ALT (SGPT): 18 U/L (ref 12–65)
AST (SGOT): 10 U/L — ABNORMAL LOW (ref 15–37)
Albumin: 2.5 g/dL — ABNORMAL LOW (ref 3.5–5.0)
Alk. phosphatase: 57 U/L (ref 50–136)
Anion gap: 7 mmol/L (ref 7–16)
BUN: 8 MG/DL (ref 6–23)
Bilirubin, total: 0.7 MG/DL (ref 0.2–1.1)
CO2: 26 mmol/L (ref 21–32)
Calcium: 8.1 MG/DL — ABNORMAL LOW (ref 8.3–10.4)
Chloride: 108 mmol/L — ABNORMAL HIGH (ref 98–107)
Creatinine: 0.9 MG/DL (ref 0.6–1.0)
GFR est AA: >60 mL/min/{1.73_m2} (ref >60)
GFR est non-AA: >60 mL/min/{1.73_m2} (ref >60)
Globulin: 3.7 g/dL — ABNORMAL HIGH (ref 2.3–3.5)
Glucose: 158 mg/dL — ABNORMAL HIGH (ref 65–100)
Potassium: 3.2 mmol/L — ABNORMAL LOW (ref 3.5–5.1)
Protein, total: 6.2 g/dL — ABNORMAL LOW (ref 6.3–8.2)
Sodium: 141 mmol/L (ref 136–145)

## 2020-10-29 LAB — CBC WITH AUTOMATED DIFF
ABS. BASOPHILS: 0 10*3/uL (ref 0.0–0.2)
ABS. EOSINOPHILS: 0 10*3/uL (ref 0.0–0.8)
ABS. IMM. GRANS.: 0 10*3/uL (ref 0.0–0.5)
ABS. LYMPHOCYTES: 0.6 10*3/uL (ref 0.5–4.6)
ABS. MONOCYTES: 0.7 10*3/uL (ref 0.1–1.3)
ABS. NEUTROPHILS: 6 10*3/uL (ref 1.7–8.2)
ABSOLUTE NRBC: 0 10*3/uL (ref 0.0–0.2)
BASOPHILS: 0 % (ref 0.0–2.0)
EOSINOPHILS: 0 % — ABNORMAL LOW (ref 0.5–7.8)
HCT: 31.5 % — ABNORMAL LOW (ref 35.8–46.3)
HGB: 10.6 g/dL — ABNORMAL LOW (ref 11.7–15.4)
IMMATURE GRANULOCYTES: 0 % (ref 0.0–5.0)
LYMPHOCYTES: 8 % — ABNORMAL LOW (ref 13–44)
MCH: 29.1 PG (ref 26.1–32.9)
MCHC: 33.7 g/dL (ref 31.4–35.0)
MCV: 86.5 FL (ref 79.6–97.8)
MONOCYTES: 9 % (ref 4.0–12.0)
MPV: 9.7 FL (ref 9.4–12.3)
NEUTROPHILS: 83 % — ABNORMAL HIGH (ref 43–78)
PLATELET: 184 10*3/uL (ref 150–450)
RBC: 3.64 M/uL — ABNORMAL LOW (ref 4.05–5.2)
RDW: 14.3 % (ref 11.9–14.6)
WBC: 7.3 10*3/uL (ref 4.3–11.1)

## 2020-10-29 LAB — DRUG SCREEN, URINE
AMPHETAMINES: POSITIVE
Amphetamine Screen, Urine: POSITIVE
BARBITURATES: NEGATIVE
BENZODIAZEPINES: NEGATIVE
Barbiturate Screen, Urine: NEGATIVE
Benzodiazepine Screen, Urine: NEGATIVE
COCAINE: NEGATIVE
Cocaine Screen Urine: NEGATIVE
METHADONE: NEGATIVE
Methadone Screen, Urine: NEGATIVE
OPIATES: NEGATIVE
Opiate Screen, Urine: NEGATIVE
PCP Screen, Urine: NEGATIVE
PCP(PHENCYCLIDINE): NEGATIVE
THC (TH-CANNABINOL): POSITIVE
THC Screen, Urine: POSITIVE

## 2020-10-29 LAB — NT-PRO BNP: NT pro-BNP: 66 PG/ML (ref 5–125)

## 2020-10-29 LAB — MAGNESIUM
Magnesium: 1.9 mg/dL (ref 1.8–2.4)
Magnesium: 1.9 mg/dL (ref 1.8–2.4)

## 2020-10-29 LAB — TROPONIN-HIGH SENSITIVITY
Troponin-High Sensitivity: 3.2 pg/mL (ref 0–14)
Troponin-High Sensitivity: 3.3 pg/mL (ref 0–14)

## 2020-10-29 LAB — PHOSPHORUS
Phosphorus: 2.5 MG/DL (ref 2.5–4.5)
Phosphorus: 2.5 MG/DL (ref 2.5–4.5)

## 2020-10-29 LAB — D DIMER: D DIMER: 2.67 ug/ml(FEU) — ABNORMAL HIGH (ref <0.56)

## 2020-10-29 LAB — LIPASE
Lipase: 45 U/L — ABNORMAL LOW (ref 73–393)
Lipase: 45 U/L — ABNORMAL LOW (ref 73–393)

## 2020-10-29 LAB — COMPREHENSIVE METABOLIC PANEL
ALT: 18 U/L (ref 12–65)
AST: 10 U/L — ABNORMAL LOW (ref 15–37)
Albumin/Globulin Ratio: 0.7 — ABNORMAL LOW (ref 1.2–3.5)
Albumin: 2.5 g/dL — ABNORMAL LOW (ref 3.5–5.0)
Alkaline Phosphatase: 57 U/L (ref 50–136)
Anion Gap: 7 mmol/L (ref 7–16)
BUN: 8 MG/DL (ref 6–23)
CO2: 26 mmol/L (ref 21–32)
Calcium: 8.1 MG/DL — ABNORMAL LOW (ref 8.3–10.4)
Chloride: 108 mmol/L — ABNORMAL HIGH (ref 98–107)
Creatinine: 0.9 MG/DL (ref 0.6–1.0)
EGFR IF NonAfrican American: >60 mL/min/{1.73_m2} (ref >60)
GFR African American: >60 mL/min/{1.73_m2} (ref >60)
Globulin: 3.7 g/dL — ABNORMAL HIGH (ref 2.3–3.5)
Glucose: 158 mg/dL — ABNORMAL HIGH (ref 65–100)
Potassium: 3.2 mmol/L — ABNORMAL LOW (ref 3.5–5.1)
Sodium: 141 mmol/L (ref 136–145)
Total Bilirubin: 0.7 MG/DL (ref 0.2–1.1)
Total Protein: 6.2 g/dL — ABNORMAL LOW (ref 6.3–8.2)

## 2020-10-29 LAB — EKG 12-LEAD
Atrial Rate: 94 {beats}/min
P Axis: 84 degrees
P-R Interval: 129 ms
Q-T Interval: 367 ms
QRS Duration: 91 ms
QTc Calculation (Bazett): 459 ms
R Axis: 87 degrees
T Axis: 13 degrees
Ventricular Rate: 94 {beats}/min

## 2020-10-29 LAB — CBC WITH AUTO DIFFERENTIAL
Basophils %: 0 % (ref 0.0–2.0)
Basophils Absolute: 0 10*3/uL (ref 0.0–0.2)
Eosinophils %: 0 % — ABNORMAL LOW (ref 0.5–7.8)
Eosinophils Absolute: 0 10*3/uL (ref 0.0–0.8)
Granulocyte Absolute Count: 0 10*3/uL (ref 0.0–0.5)
Hematocrit: 31.5 % — ABNORMAL LOW (ref 35.8–46.3)
Hemoglobin: 10.6 g/dL — ABNORMAL LOW (ref 11.7–15.4)
Immature Granulocytes: 0 % (ref 0.0–5.0)
Lymphocytes %: 8 % — ABNORMAL LOW (ref 13–44)
Lymphocytes Absolute: 0.6 10*3/uL (ref 0.5–4.6)
MCH: 29.1 PG (ref 26.1–32.9)
MCHC: 33.7 g/dL (ref 31.4–35.0)
MCV: 86.5 FL (ref 79.6–97.8)
MPV: 9.7 FL (ref 9.4–12.3)
Monocytes %: 9 % (ref 4.0–12.0)
Monocytes Absolute: 0.7 10*3/uL (ref 0.1–1.3)
NRBC Absolute: 0 10*3/uL (ref 0.0–0.2)
Neutrophils %: 83 % — ABNORMAL HIGH (ref 43–78)
Neutrophils Absolute: 6 10*3/uL (ref 1.7–8.2)
Platelets: 184 10*3/uL (ref 150–450)
RBC: 3.64 M/uL — ABNORMAL LOW (ref 4.05–5.2)
RDW: 14.3 % (ref 11.9–14.6)
WBC: 7.3 10*3/uL (ref 4.3–11.1)

## 2020-10-29 LAB — PROBNP, N-TERMINAL: BNP: 66 PG/ML (ref 5–125)

## 2020-10-29 LAB — TROPONIN, HIGH SENSITIVITY
Troponin, High Sensitivity: 3.2 pg/mL (ref 0–14)
Troponin, High Sensitivity: 3.3 pg/mL (ref 0–14)

## 2020-10-29 LAB — D-DIMER, QUANTITATIVE: D-Dimer, Quant: 2.67 ug/ml(FEU) — ABNORMAL HIGH (ref <0.56)

## 2020-10-29 MED ORDER — SODIUM CHLORIDE 0.9 % IJ SYRG
Freq: Three times a day (TID) | INTRAMUSCULAR | Status: DC
Start: 2020-10-29 — End: 2020-10-29

## 2020-10-29 MED ORDER — SODIUM CHLORIDE 0.9% BOLUS IV
0.9 % | Freq: Once | INTRAVENOUS | Status: AC
Start: 2020-10-29 — End: 2020-10-29
  Administered 2020-10-29: 08:00:00 via INTRAVENOUS

## 2020-10-29 MED ORDER — SODIUM CHLORIDE 0.9 % IJ SYRG
INTRAMUSCULAR | Status: DC | PRN
Start: 2020-10-29 — End: 2020-10-29

## 2020-10-29 MED ORDER — IOPAMIDOL 76 % IV SOLN
76 % | Freq: Once | INTRAVENOUS | Status: AC
Start: 2020-10-29 — End: 2020-10-29
  Administered 2020-10-29: 08:00:00 via INTRAVENOUS

## 2020-10-29 MED ORDER — LISINOPRIL 10 MG TAB
10 mg | ORAL_TABLET | Freq: Every day | ORAL | 0 refills | Status: AC
Start: 2020-10-29 — End: 2020-11-28

## 2020-10-29 NOTE — ED Provider Notes (Signed)
40 year old female patient presents with 2-day history of left-sided chest pain.  She describes it as a sharp needle scratching type pain that radiates into her back  She has had no shortness of breath cough fever vomiting or diarrhea  Patient states she has a history of hypertension but has not been on medications for quite some time.  Also concerned that she may be a diabetic.  Patient reports that she was given medicine by EMS which helped her pain.  Does note indicates that the patient was given aspirin Zofran and 1 nitroglycerin in route.  The nitroglycerin did drop her blood pressure from the 140s down to 100 and therefore no further medications were administered    The history is provided by the patient and the EMS personnel.   Chest Pain   This is a new problem. The current episode started 2 days ago. The problem has been gradually worsening. The problem occurs constantly. The pain is associated with normal activity. The pain is present in the left side. The pain is moderate. The quality of the pain is described as sharp. The pain radiates to the upper back. Exacerbated by: Patient denies discrete exacerbating factors. Pertinent negatives include no abdominal pain, no cough, no diaphoresis, no dizziness, no exertional chest pressure, no fever, no headaches, no leg pain, no malaise/fatigue, no palpitations, no shortness of breath and no vomiting. She has tried nothing for the symptoms. Risk factors include smoking/tobacco exposure, hypertension and obesity. Her past medical history is significant for HTN.Her past medical history does not include DM. Pertinent negatives include no cardiac catheterization.       Past Medical History:   Diagnosis Date   ??? Asthma    ??? Hypertension        No past surgical history on file.      No family history on file.    Social History     Socioeconomic History   ??? Marital status: SINGLE     Spouse name: Not on file   ??? Number of children: Not on file   ??? Years of education: Not  on file   ??? Highest education level: Not on file   Occupational History   ??? Not on file   Tobacco Use   ??? Smoking status: Current Every Day Smoker   ??? Smokeless tobacco: Not on file   Substance and Sexual Activity   ??? Alcohol use: No   ??? Drug use: Yes     Types: Marijuana   ??? Sexual activity: Not on file   Other Topics Concern   ??? Not on file   Social History Narrative   ??? Not on file     Social Determinants of Health     Financial Resource Strain:    ??? Difficulty of Paying Living Expenses: Not on file   Food Insecurity:    ??? Worried About Running Out of Food in the Last Year: Not on file   ??? Ran Out of Food in the Last Year: Not on file   Transportation Needs:    ??? Lack of Transportation (Medical): Not on file   ??? Lack of Transportation (Non-Medical): Not on file   Physical Activity:    ??? Days of Exercise per Week: Not on file   ??? Minutes of Exercise per Session: Not on file   Stress:    ??? Feeling of Stress : Not on file   Social Connections:    ??? Frequency of Communication with Friends and Family:  Not on file   ??? Frequency of Social Gatherings with Friends and Family: Not on file   ??? Attends Religious Services: Not on file   ??? Active Member of Clubs or Organizations: Not on file   ??? Attends Banker Meetings: Not on file   ??? Marital Status: Not on file   Intimate Partner Violence:    ??? Fear of Current or Ex-Partner: Not on file   ??? Emotionally Abused: Not on file   ??? Physically Abused: Not on file   ??? Sexually Abused: Not on file   Housing Stability:    ??? Unable to Pay for Housing in the Last Year: Not on file   ??? Number of Places Lived in the Last Year: Not on file   ??? Unstable Housing in the Last Year: Not on file         ALLERGIES: Bees [hymenoptera allergenic extract]    Review of Systems   Constitutional: Negative for chills, diaphoresis, fever and malaise/fatigue.   HENT: Negative for congestion, ear pain and rhinorrhea.    Eyes: Negative for photophobia and discharge.   Respiratory: Negative for  cough and shortness of breath.    Cardiovascular: Positive for chest pain. Negative for palpitations.   Gastrointestinal: Negative for abdominal pain, constipation, diarrhea and vomiting.   Endocrine: Negative for cold intolerance and heat intolerance.   Genitourinary: Negative for dysuria and flank pain.   Musculoskeletal: Negative for arthralgias, myalgias and neck pain.   Skin: Negative for rash and wound.   Allergic/Immunologic: Negative for environmental allergies and food allergies.   Neurological: Negative for dizziness, syncope and headaches.   Hematological: Negative for adenopathy. Does not bruise/bleed easily.   Psychiatric/Behavioral: Negative for dysphoric mood. The patient is not nervous/anxious.    All other systems reviewed and are negative.      Vitals:    10/29/20 0102 10/29/20 0104   BP:  139/73   Pulse: 97 94   Resp: 22 20   Temp:  98 ??F (36.7 ??C)   SpO2: 98% 96%   Weight:  87.1 kg (192 lb)   Height:  5\' 3"  (1.6 m)            Physical Exam  Vitals and nursing note reviewed.   Constitutional:       General: She is in acute distress.      Appearance: Normal appearance. She is well-developed and normal weight.   HENT:      Head: Normocephalic and atraumatic.      Right Ear: External ear normal.      Left Ear: External ear normal.      Mouth/Throat:      Pharynx: No oropharyngeal exudate.   Eyes:      Extraocular Movements: Extraocular movements intact.      Conjunctiva/sclera: Conjunctivae normal.      Pupils: Pupils are equal, round, and reactive to light.   Neck:      Vascular: No JVD.   Cardiovascular:      Rate and Rhythm: Normal rate and regular rhythm.      Heart sounds: Normal heart sounds. No murmur heard.  No friction rub. No gallop.    Pulmonary:      Effort: Pulmonary effort is normal.      Breath sounds: Normal breath sounds.   Abdominal:      General: Bowel sounds are normal. There is no distension.      Palpations: Abdomen is soft. There is no mass.  Tenderness: There is no  abdominal tenderness.   Musculoskeletal:         General: No deformity. Normal range of motion.      Cervical back: Normal range of motion and neck supple.   Skin:     General: Skin is warm and dry.      Capillary Refill: Capillary refill takes less than 2 seconds.      Findings: No rash.   Neurological:      General: No focal deficit present.      Mental Status: She is alert and oriented to person, place, and time.      Cranial Nerves: No cranial nerve deficit.      Sensory: No sensory deficit.      Gait: Gait normal.   Psychiatric:         Mood and Affect: Affect is blunt and flat.         Speech: Speech normal.         Behavior: Behavior normal.         Thought Content: Thought content normal.         Judgment: Judgment normal.      Comments: Patient is very sleepy but arousable.  She dozed off several times during my interview process  By the time we completed the exam she was more awake and more conversant          MDM  Number of Diagnoses or Management Options  Amphetamine abuse (HCC): established and worsening  Atypical chest pain: new and requires workup  Diagnosis management comments: 40 year old female patient here with sharp and scratching left-sided chest pain onset was Saturday evening    Patient has a history of hypertension and she is a smoker.  She does not currently have a doctor and not currently on any medications    Initial EKG was unremarkable  Chest x-ray revealed no acute cardiopulmonary disease  Initial troponin is negative  Will monitor    Will check UDS given the patient's grogginess and history of prior substance abuse  Will monitor and recheck troponin    5:01 AM  Work-up today reveals an elevated D-dimer but no other acute findings on the patient's lab work    CT PE protocol negative for PE  Unclear why the patient has a filling defect at the radiologist noted on the initial CT.  She will be referred for outpatient follow-up with primary care for this.  Patient's renal function today was  normal       Amount and/or Complexity of Data Reviewed  Clinical lab tests: ordered and reviewed  Tests in the radiology section of CPT??: ordered and reviewed  Tests in the medicine section of CPT??: ordered and reviewed  Decide to obtain previous medical records or to obtain history from someone other than the patient: yes  Review and summarize past medical records: yes  Independent visualization of images, tracings, or specimens: yes    Risk of Complications, Morbidity, and/or Mortality  Presenting problems: moderate  Diagnostic procedures: moderate  Management options: moderate  General comments: Elements of this note have been dictated via voice recognition software.  Text and phrases may be limited by the accuracy of the software.  The chart has been reviewed, but errors may still be present.      Patient Progress  Patient progress: stable         EKG    Date/Time: 10/29/2020 2:03 AM  Performed by: Vernell Barrier, MD  Authorized by:  Aeson Sawyers, Deeann Dowse, MD     ECG reviewed by ED Physician in the absence of a cardiologist: yes    Previous ECG:     Previous ECG:  Compared to current    Similarity:  No change  Interpretation:     Interpretation: non-specific    Rate:     ECG rate assessment: normal    Rhythm:     Rhythm: sinus rhythm    Ectopy:     Ectopy: none    QRS:     QRS axis:  Normal    QRS intervals:  Normal  Conduction:     Conduction: normal    ST segments:     ST segments:  Normal  T waves:     T waves: normal    Comments:      No acute ischemic changes  No significant interval change from August 2017

## 2020-10-29 NOTE — ED Notes (Signed)
I have reviewed discharge instructions with the patient.  The patient verbalized understanding.    Patient left ED via Discharge Method: ambulatory to Home with self.    Opportunity for questions and clarification provided.       Patient given 1 scripts.         To continue your aftercare when you leave the hospital, you may receive an automated call from our care team to check in on how you are doing.  This is a free service and part of our promise to provide the best care and service to meet your aftercare needs." If you have questions, or wish to unsubscribe from this service please call [phone].  Thank you for Choosing our [facility].

## 2020-10-29 NOTE — ED Notes (Signed)
Pt arrives via [facility] from hotel CP radiating to back/ lower abd pain. Nausea no vomiting. Hx of afib, not taking medications for awhile. 1 nitro, drop in BP from 140's to 100's, along with 324 aspirin, 4 zofran. BGL 164. denies SHOB at this time.

## 2020-11-02 ENCOUNTER — Emergency Department: Admit: 2020-11-02

## 2020-11-02 ENCOUNTER — Inpatient Hospital Stay
Admit: 2020-11-02 | Discharge: 2020-11-02 | Disposition: A | Discharge status: Home / Self Care | Attending: Emergency Medicine

## 2020-11-02 DIAGNOSIS — R0600 Dyspnea, unspecified: Secondary | ICD-10-CM

## 2020-11-02 LAB — EKG, 12 LEAD, INITIAL
Atrial Rate: 114 {beats}/min
Calculated P Axis: 81 degrees
Calculated R Axis: 85 degrees
Calculated T Axis: -8 degrees
P-R Interval: 122 ms
Q-T Interval: 338 ms
QRS Duration: 70 ms
QTC Calculation (Bezet): 465 ms
Ventricular Rate: 114 {beats}/min

## 2020-11-02 LAB — CBC WITH AUTOMATED DIFF
ABS. BASOPHILS: 0 10*3/uL (ref 0.0–0.2)
ABS. EOSINOPHILS: 0.1 10*3/uL (ref 0.0–0.8)
ABS. IMM. GRANS.: 0.1 10*3/uL (ref 0.0–0.5)
ABS. LYMPHOCYTES: 1.3 10*3/uL (ref 0.5–4.6)
ABS. MONOCYTES: 2.3 10*3/uL — ABNORMAL HIGH (ref 0.1–1.3)
ABS. NEUTROPHILS: 8 10*3/uL (ref 1.7–8.2)
ABSOLUTE NRBC: 0 10*3/uL (ref 0.0–0.2)
BASOPHILS: 0 % (ref 0.0–2.0)
EOSINOPHILS: 1 % (ref 0.5–7.8)
HCT: 34.7 % — ABNORMAL LOW (ref 35.8–46.3)
HGB: 12 g/dL (ref 11.7–15.4)
IMMATURE GRANULOCYTES: 1 % (ref 0.0–5.0)
LYMPHOCYTES: 11 % — ABNORMAL LOW (ref 13–44)
MCH: 29.3 PG (ref 26.1–32.9)
MCHC: 34.6 g/dL (ref 31.4–35.0)
MCV: 84.6 FL (ref 79.6–97.8)
MONOCYTES: 19 % — ABNORMAL HIGH (ref 4.0–12.0)
MPV: 10.7 FL (ref 9.4–12.3)
NEUTROPHILS: 68 % (ref 43–78)
PLATELET: 309 10*3/uL (ref 150–450)
RBC: 4.1 M/uL (ref 4.05–5.2)
RDW: 14.2 % (ref 11.9–14.6)
WBC: 11.8 10*3/uL — ABNORMAL HIGH (ref 4.3–11.1)

## 2020-11-02 LAB — NT-PRO BNP: NT pro-BNP: 22 PG/ML (ref 5–125)

## 2020-11-02 LAB — METABOLIC PANEL, COMPREHENSIVE
A-G Ratio: 0.5 — ABNORMAL LOW (ref 1.2–3.5)
ALT (SGPT): 34 U/L (ref 12–65)
AST (SGOT): 24 U/L (ref 15–37)
Albumin: 2.5 g/dL — ABNORMAL LOW (ref 3.5–5.0)
Alk. phosphatase: 71 U/L (ref 50–136)
Anion gap: 8 mmol/L (ref 7–16)
BUN: 6 MG/DL (ref 6–23)
Bilirubin, total: 0.8 MG/DL (ref 0.2–1.1)
CO2: 27 mmol/L (ref 21–32)
Calcium: 8.9 MG/DL (ref 8.3–10.4)
Chloride: 101 mmol/L (ref 98–107)
Creatinine: 0.9 MG/DL (ref 0.6–1.0)
GFR est AA: >60 mL/min/{1.73_m2} (ref >60)
GFR est non-AA: >60 mL/min/{1.73_m2} (ref >60)
Globulin: 5.2 g/dL — ABNORMAL HIGH (ref 2.3–3.5)
Glucose: 133 mg/dL — ABNORMAL HIGH (ref 65–100)
Potassium: 3.3 mmol/L — ABNORMAL LOW (ref 3.5–5.1)
Protein, total: 7.7 g/dL (ref 6.3–8.2)
Sodium: 136 mmol/L (ref 136–145)

## 2020-11-02 LAB — MAGNESIUM
Magnesium: 2.5 mg/dL — ABNORMAL HIGH (ref 1.8–2.4)
Magnesium: 2.5 mg/dL — ABNORMAL HIGH (ref 1.8–2.4)

## 2020-11-02 LAB — TROPONIN-HIGH SENSITIVITY: Troponin-High Sensitivity: 4.6 pg/mL (ref 0–14)

## 2020-11-02 LAB — CBC WITH AUTO DIFFERENTIAL
Basophils %: 0 % (ref 0.0–2.0)
Basophils Absolute: 0 10*3/uL (ref 0.0–0.2)
Eosinophils %: 1 % (ref 0.5–7.8)
Eosinophils Absolute: 0.1 10*3/uL (ref 0.0–0.8)
Granulocyte Absolute Count: 0.1 10*3/uL (ref 0.0–0.5)
Hematocrit: 34.7 % — ABNORMAL LOW (ref 35.8–46.3)
Hemoglobin: 12 g/dL (ref 11.7–15.4)
Immature Granulocytes: 1 % (ref 0.0–5.0)
Lymphocytes %: 11 % — ABNORMAL LOW (ref 13–44)
Lymphocytes Absolute: 1.3 10*3/uL (ref 0.5–4.6)
MCH: 29.3 PG (ref 26.1–32.9)
MCHC: 34.6 g/dL (ref 31.4–35.0)
MCV: 84.6 FL (ref 79.6–97.8)
MPV: 10.7 FL (ref 9.4–12.3)
Monocytes %: 19 % — ABNORMAL HIGH (ref 4.0–12.0)
Monocytes Absolute: 2.3 10*3/uL — ABNORMAL HIGH (ref 0.1–1.3)
NRBC Absolute: 0 10*3/uL (ref 0.0–0.2)
Neutrophils %: 68 % (ref 43–78)
Neutrophils Absolute: 8 10*3/uL (ref 1.7–8.2)
Platelets: 309 10*3/uL (ref 150–450)
RBC: 4.1 M/uL (ref 4.05–5.2)
RDW: 14.2 % (ref 11.9–14.6)
WBC: 11.8 10*3/uL — ABNORMAL HIGH (ref 4.3–11.1)

## 2020-11-02 LAB — COMPREHENSIVE METABOLIC PANEL
ALT: 34 U/L (ref 12–65)
AST: 24 U/L (ref 15–37)
Albumin/Globulin Ratio: 0.5 — ABNORMAL LOW (ref 1.2–3.5)
Albumin: 2.5 g/dL — ABNORMAL LOW (ref 3.5–5.0)
Alkaline Phosphatase: 71 U/L (ref 50–136)
Anion Gap: 8 mmol/L (ref 7–16)
BUN: 6 MG/DL (ref 6–23)
CO2: 27 mmol/L (ref 21–32)
Calcium: 8.9 MG/DL (ref 8.3–10.4)
Chloride: 101 mmol/L (ref 98–107)
Creatinine: 0.9 MG/DL (ref 0.6–1.0)
EGFR IF NonAfrican American: >60 mL/min/{1.73_m2} (ref >60)
GFR African American: >60 mL/min/{1.73_m2} (ref >60)
Globulin: 5.2 g/dL — ABNORMAL HIGH (ref 2.3–3.5)
Glucose: 133 mg/dL — ABNORMAL HIGH (ref 65–100)
Potassium: 3.3 mmol/L — ABNORMAL LOW (ref 3.5–5.1)
Sodium: 136 mmol/L (ref 136–145)
Total Bilirubin: 0.8 MG/DL (ref 0.2–1.1)
Total Protein: 7.7 g/dL (ref 6.3–8.2)

## 2020-11-02 LAB — TROPONIN, HIGH SENSITIVITY: Troponin, High Sensitivity: 4.6 pg/mL (ref 0–14)

## 2020-11-02 LAB — EKG 12-LEAD
Atrial Rate: 114 {beats}/min
P Axis: 81 degrees
P-R Interval: 122 ms
Q-T Interval: 338 ms
QRS Duration: 70 ms
QTc Calculation (Bazett): 465 ms
R Axis: 85 degrees
T Axis: -8 degrees
Ventricular Rate: 114 {beats}/min

## 2020-11-02 LAB — PROBNP, N-TERMINAL: BNP: 22 PG/ML (ref 5–125)

## 2020-11-02 MED ORDER — CEPHALEXIN 500 MG CAP
500 mg | ORAL_CAPSULE | Freq: Four times a day (QID) | ORAL | 0 refills | Status: AC
Start: 2020-11-02 — End: 2020-11-09

## 2020-11-02 MED ORDER — SODIUM CHLORIDE 0.9 % IJ SYRG
Freq: Three times a day (TID) | INTRAMUSCULAR | Status: DC
Start: 2020-11-02 — End: 2020-11-02

## 2020-11-02 MED ORDER — SODIUM CHLORIDE 0.9 % IJ SYRG
INTRAMUSCULAR | Status: DC | PRN
Start: 2020-11-02 — End: 2020-11-02

## 2020-11-02 MED ORDER — PREDNISONE 10 MG TABLETS IN A DOSE PACK
10 mg | ORAL_TABLET | ORAL | 0 refills | Status: AC
Start: 2020-11-02 — End: ?

## 2020-11-02 NOTE — ED Notes (Signed)
Report received from [facility]

## 2020-11-02 NOTE — ED Notes (Signed)
Report given to [facility] and Arlice Colt.

## 2020-11-02 NOTE — ED Notes (Signed)

## 2020-11-02 NOTE — ED Notes (Signed)
Pt presents to the ED c/o SOB, onset a week ago. Pt also states having diffuse chest tightness worse with walking to a store this evening. Pt reports having a h/o CHF and HTN. Pt denies leg swelling, syncope, cough, fever, chills, abdominal pain, and any other complaint.

## 2020-11-02 NOTE — ED Provider Notes (Signed)
40 year old female complaining of fatigue and dyspnea on exertion      Shortness of Breath  This is a recurrent problem. The current episode started more than 2 days ago. The problem has not changed since onset.Associated symptoms include headaches. Pertinent negatives include no fever, no cough, no sputum production, no hemoptysis, no wheezing and no orthopnea. It is unknown what precipitated the problem.        Past Medical History:   Diagnosis Date   ??? Asthma    ??? Heart failure (HCC)    ??? Hypertension        History reviewed. No pertinent surgical history.      History reviewed. No pertinent family history.    Social History     Socioeconomic History   ??? Marital status: SINGLE     Spouse name: Not on file   ??? Number of children: Not on file   ??? Years of education: Not on file   ??? Highest education level: Not on file   Occupational History   ??? Not on file   Tobacco Use   ??? Smoking status: Current Every Day Smoker   ??? Smokeless tobacco: Not on file   Substance and Sexual Activity   ??? Alcohol use: No   ??? Drug use: Yes     Types: Marijuana   ??? Sexual activity: Not on file   Other Topics Concern   ??? Not on file   Social History Narrative   ??? Not on file     Social Determinants of Health     Financial Resource Strain:    ??? Difficulty of Paying Living Expenses: Not on file   Food Insecurity:    ??? Worried About Running Out of Food in the Last Year: Not on file   ??? Ran Out of Food in the Last Year: Not on file   Transportation Needs:    ??? Lack of Transportation (Medical): Not on file   ??? Lack of Transportation (Non-Medical): Not on file   Physical Activity:    ??? Days of Exercise per Week: Not on file   ??? Minutes of Exercise per Session: Not on file   Stress:    ??? Feeling of Stress : Not on file   Social Connections:    ??? Frequency of Communication with Friends and Family: Not on file   ??? Frequency of Social Gatherings with Friends and Family: Not on file   ??? Attends Religious Services: Not on file   ??? Active Member of  Clubs or Organizations: Not on file   ??? Attends Banker Meetings: Not on file   ??? Marital Status: Not on file   Intimate Partner Violence:    ??? Fear of Current or Ex-Partner: Not on file   ??? Emotionally Abused: Not on file   ??? Physically Abused: Not on file   ??? Sexually Abused: Not on file   Housing Stability:    ??? Unable to Pay for Housing in the Last Year: Not on file   ??? Number of Places Lived in the Last Year: Not on file   ??? Unstable Housing in the Last Year: Not on file         ALLERGIES: Bees [hymenoptera allergenic extract]    Review of Systems   Constitutional: Negative for fever.   Respiratory: Positive for shortness of breath. Negative for cough, hemoptysis, sputum production and wheezing.    Cardiovascular: Negative for orthopnea.   Genitourinary: Positive for dysuria.  Neurological: Positive for headaches.       Vitals:    11/02/20 0630 11/02/20 0700 11/02/20 0802 11/02/20 0834   BP: 129/72 116/65 129/84 (!) 152/68   Pulse:       Resp:       Temp:       SpO2: 95% 94% 97%    Weight:       Height:                Physical Exam  Vitals and nursing note reviewed.   Constitutional:       General: She is not in acute distress.     Appearance: She is well-developed.   HENT:      Head: Normocephalic and atraumatic.      Right Ear: External ear normal.      Left Ear: External ear normal.      Nose: Nose normal.   Eyes:      General: No scleral icterus.        Right eye: No discharge.         Left eye: No discharge.      Conjunctiva/sclera: Conjunctivae normal.      Pupils: Pupils are equal, round, and reactive to light.   Cardiovascular:      Rate and Rhythm: Regular rhythm.   Pulmonary:      Effort: Pulmonary effort is normal. No respiratory distress.      Breath sounds: Normal breath sounds. No stridor. No wheezing or rales.   Abdominal:      General: Bowel sounds are normal. There is no distension.      Palpations: Abdomen is soft.      Tenderness: There is no abdominal tenderness.    Musculoskeletal:         General: Normal range of motion.      Cervical back: Normal range of motion.   Skin:     General: Skin is warm and dry.      Findings: No rash.   Neurological:      Mental Status: She is alert and oriented to person, place, and time.      Motor: No abnormal muscle tone.      Coordination: Coordination normal.   Psychiatric:         Behavior: Behavior normal.          MDM  Number of Diagnoses or Management Options  Diagnosis management comments: Differential diagnosis: PE, CHF, hypertension, malingering, pneumonia    Patient's note distress resting comfortably SaO2 95% or greater on room air.  Patient was seen 4 days ago at [facility] for similar symptoms she had a CAT scan for PE which ruled out.  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  She was reminded today that she needs to have that followed up as an outpatient.           Amount and/or Complexity of Data Reviewed  Clinical lab tests: ordered and reviewed  Tests in the radiology section of CPT??: ordered and reviewed  Tests in the medicine section of CPT??: ordered and reviewed  Decide to obtain previous medical records or to obtain history from someone other than the patient: yes  Review and summarize past medical records: yes  Independent visualization of images, tracings, or specimens: yes    Risk of Complications, Morbidity, and/or Mortality  Presenting problems: high  Diagnostic procedures: high  Management options: high           Procedures

## 2021-01-22 ENCOUNTER — Emergency Department: Payer: BLUE CROSS/BLUE SHIELD

## 2021-01-22 ENCOUNTER — Inpatient Hospital Stay
Admit: 2021-01-22 | Discharge: 2021-01-23 | Disposition: A | Discharge status: Home / Self Care | Payer: BLUE CROSS/BLUE SHIELD | Attending: Emergency Medicine

## 2021-01-22 DIAGNOSIS — L03011 Cellulitis of right finger: Secondary | ICD-10-CM

## 2021-01-22 NOTE — ED Provider Notes (Signed)
[facility] Emergency Department Provider Note                   PCP:                No primary care provider on file.               Age: 39 y.o.      Sex: female       ICD-10-CM    1. Cellulitis of right thumb  L03.011        DISPOSITION Eloped - Left Before Treatment Complete 01/22/2021 09:54:25 PM        MDM  Number of Diagnoses or Management Options  Cellulitis of right thumb  Diagnosis management comments:     In summary this a well-appearing 40 year old female who presented today for pain and swelling noted to the distal aspect of the right thumb.  On exam there is a white discoloration to the distal finger extending underneath the distal aspect of the nailbed consistent with abscess.  Patient unsure of any known injury, x-ray was ordered to further evaluate the rarity of the issue to rule out osteomyelitis or possible foreign body.  X-ray apparently attempted multiple times to locate patient and were unable to locate her, I also attempted to locate the patient in all 3 waiting rooms and patient was unable to be located apparently she eloped without any additional treatment and left against medical advice.       Amount and/or Complexity of Data Reviewed  Tests in the radiology section of CPT??: ordered         Orders Placed This Encounter   Procedures   ??? XR FINGER RIGHT (MIN 2 VIEWS)        Stacy Zamora is a 40 y.o. female who presents to the Emergency Department with chief complaint of    Chief Complaint   Patient presents with   ??? Finger Pain      40 year old female presents today for evaluation of right thumb pain.  Symptoms began a few days ago.  She reports associated swelling and discoloration noted to the distal aspect of the left thumb.  She denies any known injury or trauma but admits that she previously had on acrylic nails and thinks that she may have hit the thumbnail on something that may be lifted the nail.  She has since remove the acrylic nails and noticed some discoloration underneath the  fingernail.  She denies any numbness or tingling, no associated diffuse swelling of the finger.  No red streaking or fevers.  Is right-hand dominant.            Review of Systems   Constitutional: Negative for activity change, chills and fever.   Respiratory: Negative for shortness of breath.    Cardiovascular: Negative for chest pain.   Musculoskeletal:        Right thumb pain   Skin: Positive for color change.   Allergic/Immunologic: Negative for immunocompromised state.   Neurological: Negative for numbness.       Past Medical History:   Diagnosis Date   ??? Asthma    ??? Heart failure (HCC)    ??? Hypertension         No past surgical history on file.     No family history on file.        Social Connections:    ??? Frequency of Communication with Friends and Family: Not on file   ??? Frequency of Social Gatherings  with Friends and Family: Not on file   ??? Attends Religious Services: Not on file   ??? Active Member of Clubs or Organizations: Not on file   ??? Attends Banker Meetings: Not on file   ??? Marital Status: Not on file        Allergies   Allergen Reactions   ??? Wasp Venom Protein Swelling        Vitals signs and nursing note reviewed.   Patient Vitals for the past 4 hrs:   Temp Pulse Resp BP SpO2   01/22/21 2007 97.9 ??F (36.6 ??C) 90 20 125/87 100 %          Physical Exam  Vitals and nursing note reviewed.   Constitutional:       General: She is not in acute distress.     Appearance: Normal appearance.   HENT:      Head: Normocephalic and atraumatic.   Eyes:      Conjunctiva/sclera: Conjunctivae normal.   Pulmonary:      Effort: Pulmonary effort is normal.   Musculoskeletal:      Left hand: Tenderness present. Normal range of motion. Normal capillary refill.        Hands:    Skin:     General: Skin is warm and dry.      Capillary Refill: Capillary refill takes less than 2 seconds.   Neurological:      General: No focal deficit present.      Mental Status: She is alert and oriented to person, place, and time.    Psychiatric:         Mood and Affect: Mood normal.         Thought Content: Thought content normal.         Judgment: Judgment normal.          Procedures      Labs Reviewed - No data to display     XR FINGER RIGHT (MIN 2 VIEWS)    (Results Pending)                    ED Course as of 01/22/21 2159   Tue Jan 22, 2021   2139 Called x-ray to check on status of patient's x-ray as it has been marked as begun for quite some time now.  X-ray tech states they have been unable to locate patient but I was not notified of this. [KE]   2141 I checked all 3 waiting rooms for patient, unable to locate. [KE]      ED Course User Index  [KE] Riki Rusk, PA        Voice dictation software was used during the making of this note.  This software is not perfect and grammatical and other typographical errors may be present.  This note has not been completely proofread for errors.        [place], [place]  01/22/21 2159

## 2021-01-22 NOTE — Discharge Instructions (Signed)
Take antibiotics as prescribed for full duration of treatment.    Use warm soaks to help facilitate drainage.     If no improvement over the next 2-3 days, schedule a follow up appointment with one of the Hand specialists at [facility].    Return to the ER for any new or worsening symptoms.

## 2021-01-22 NOTE — ED Triage Notes (Signed)
Patient states right thumb pain, states this started about 1 week ago. Patient has discoloration on end of right thumb.

## 2021-01-23 MED ORDER — LIDOCAINE HCL 1 % IJ SOLN
1 % | INTRAMUSCULAR | Status: DC
Start: 2021-01-23 — End: 2021-01-22

## 2021-08-27 ENCOUNTER — Emergency Department: Admit: 2021-08-27 | Payer: BLUE CROSS/BLUE SHIELD

## 2021-08-27 ENCOUNTER — Inpatient Hospital Stay
Admit: 2021-08-27 | Discharge: 2021-08-27 | Disposition: A | Discharge status: Home / Self Care | Payer: BLUE CROSS/BLUE SHIELD | Attending: Emergency Medicine

## 2021-08-27 DIAGNOSIS — R0600 Dyspnea, unspecified: Secondary | ICD-10-CM

## 2021-08-27 DIAGNOSIS — R0602 Shortness of breath: Principal | ICD-10-CM

## 2021-08-27 LAB — COMPREHENSIVE METABOLIC PANEL
ALT: 38 U/L (ref 12–65)
AST: 14 U/L — ABNORMAL LOW (ref 15–37)
Albumin/Globulin Ratio: 1.1 (ref 0.4–1.6)
Albumin: 3.4 g/dL — ABNORMAL LOW (ref 3.5–5.0)
Alk Phosphatase: 55 U/L (ref 50–136)
Anion Gap: 4 mmol/L (ref 2–11)
BUN: 12 MG/DL (ref 6–23)
CO2: 26 mmol/L (ref 21–32)
Calcium: 8.1 MG/DL — ABNORMAL LOW (ref 8.3–10.4)
Chloride: 110 mmol/L (ref 101–110)
Creatinine: 0.9 MG/DL (ref 0.6–1.0)
Est, Glom Filt Rate: >60 mL/min/{1.73_m2} (ref >60)
Globulin: 3.2 g/dL (ref 2.8–4.5)
Glucose: 86 mg/dL (ref 65–100)
Potassium: 4.2 mmol/L (ref 3.5–5.1)
Sodium: 140 mmol/L (ref 133–143)
Total Bilirubin: 0.4 MG/DL (ref 0.2–1.1)
Total Protein: 6.6 g/dL (ref 6.3–8.2)

## 2021-08-27 LAB — CBC WITH AUTO DIFFERENTIAL
Absolute Immature Granulocyte: 0 10*3/uL (ref 0.0–0.5)
Basophils %: 1 % (ref 0.0–2.0)
Basophils Absolute: 0 10*3/uL (ref 0.0–0.2)
Eosinophils %: 1 % (ref 0.5–7.8)
Eosinophils Absolute: 0.1 10*3/uL (ref 0.0–0.8)
Hematocrit: 38.6 % (ref 35.8–46.3)
Hemoglobin: 13.3 g/dL (ref 11.7–15.4)
Immature Granulocytes: 0 % (ref 0.0–5.0)
Lymphocytes %: 39 % (ref 13–44)
Lymphocytes Absolute: 1.6 10*3/uL (ref 0.5–4.6)
MCH: 31.1 PG (ref 26.1–32.9)
MCHC: 34.5 g/dL (ref 31.4–35.0)
MCV: 90.2 FL (ref 82–102)
MPV: 9.8 FL (ref 9.4–12.3)
Monocytes %: 12 % (ref 4.0–12.0)
Monocytes Absolute: 0.5 10*3/uL (ref 0.1–1.3)
Neutrophils %: 47 % (ref 43–78)
Neutrophils Absolute: 2 10*3/uL (ref 1.7–8.2)
Platelets: 221 10*3/uL (ref 150–450)
RBC: 4.28 M/uL (ref 4.05–5.2)
RDW: 13.2 % (ref 11.9–14.6)
WBC: 4.2 10*3/uL — ABNORMAL LOW (ref 4.3–11.1)
nRBC: 0 10*3/uL (ref 0.0–0.2)

## 2021-08-27 LAB — TROPONIN: Troponin, High Sensitivity: 4 pg/mL (ref 0–14)

## 2021-08-27 LAB — MAGNESIUM: Magnesium: 2.1 mg/dL (ref 1.8–2.4)

## 2021-08-27 LAB — BRAIN NATRIURETIC PEPTIDE: NT Pro-BNP: 22 PG/ML (ref 5–125)

## 2021-08-27 NOTE — ED Triage Notes (Signed)
Pt c/o CP intermittent x1-2 weeks and SOB. Pt reports that she felt burning in her lungs yesterday after walking a long distance. Hx CHF, EKG in triage

## 2021-08-27 NOTE — ED Provider Notes (Signed)
[facility] Emergency Department Provider Note                   PCP:                None None               Age: 41 y.o.      Sex: female       ICD-10-CM    1. Dyspnea, unspecified type  R06.00           DISPOSITION Decision To Discharge 08/27/2021 01:06:34 PM       New Prescriptions    No medications on file       Orders Placed This Encounter   Procedures    XR CHEST 1 VIEW    CMP    CBC with Auto Differential    Magnesium    Troponin    Brain Natriuretic Peptide    EKG 12 Lead    Saline lock IV         Stacy Zamora is a 41 y.o. female who presents to the Emergency Department with chief complaint of    Chief Complaint   Patient presents with    Shortness of Breath      Patient to ER complaining of 1 to 2 weeks of off-and-on shortness of breath and she feels this is caused some chest pain.  She states she gets more short of breath if she walks more than about 30 minutes she has a history of heart failure.  She is on the medications.  She states she has no current physician she moved here about 2 years ago and has not gotten a primary care nor cardiologist.  She denies any fever no nausea vomiting diarrhea no urinary symptoms no back pain    Past Medical History:  No date: Asthma  No date: Heart failure (HCC)  No date: Hypertension     No past surgical history on file.         Review of Systems   All other systems reviewed and are negative.   All other systems reviewed and are negative.      Past Medical History:   Diagnosis Date    Asthma     Heart failure (HCC)     Hypertension         No past surgical history on file.     No family history on file.     Social Connections: Not on file        Allergies   Allergen Reactions    Wasp Venom Protein Swelling        Vitals signs and nursing note reviewed.   Patient Vitals for the past 4 hrs:   Temp Pulse Resp BP SpO2   08/27/21 1057 98.2 ??F (36.8 ??C) 81 17 131/71 100 %          Physical Exam  Vitals and nursing note reviewed.   Constitutional:       General: She is  not in acute distress.     Appearance: Normal appearance. She is well-developed and normal weight. She is not ill-appearing.   HENT:      Head: Normocephalic and atraumatic.      Right Ear: External ear normal.      Left Ear: External ear normal.      Nose: Nose normal.      Mouth/Throat:      Mouth: Mucous membranes are moist.  Pharynx: Oropharynx is clear.   Eyes:      Extraocular Movements: Extraocular movements intact.      Pupils: Pupils are equal, round, and reactive to light.   Cardiovascular:      Rate and Rhythm: Normal rate and regular rhythm.      Pulses: Normal pulses.      Heart sounds: Normal heart sounds.   Pulmonary:      Effort: Pulmonary effort is normal.      Breath sounds: Normal breath sounds. No decreased breath sounds, wheezing, rhonchi or rales.   Chest:      Chest wall: No tenderness.   Abdominal:      General: Bowel sounds are normal.      Palpations: Abdomen is soft.      Tenderness: There is no abdominal tenderness.      Hernia: No hernia is present.   Musculoskeletal:         General: Normal range of motion.      Cervical back: Normal range of motion and neck supple.      Right lower leg: No edema.      Left lower leg: No edema.   Skin:     General: Skin is warm and dry.   Neurological:      General: No focal deficit present.      Mental Status: She is alert.   Psychiatric:         Mood and Affect: Mood normal.         Behavior: Behavior normal.        MDM  Number of Diagnoses or Management Options  Dyspnea, unspecified type  Diagnosis management comments: Labs Reviewed  COMPREHENSIVE METABOLIC PANEL - Abnormal; Notable for the following components:     Calcium                       8.1 (*)                AST                           14 (*)                 Albumin                       3.4 (*)             All other components within normal limits  CBC WITH AUTO DIFFERENTIAL - Abnormal; Notable for the following components:     WBC                           4.2 (*)             All  other components within normal limits  MAGNESIUM  TROPONIN  BRAIN NATRIURETIC PEPTIDE     XR CHEST 1 VIEW   Final Result    No acute cardiopulmonary process.    EKG interpretation shows a normal sinus rhythm at 77 bpm no ectopy no signs of acute MI no change from previous tracings  Patient shows no clinical signs of heart failure labs are normal to include troponins and BNP.  Do not feel repeat troponin is needed due to symptoms going on for more than a week.  Stressed to patient to follow-up with primary care follow a healthy lifestyle    Complexity  of Problem: 1 stable, acute illness. (3)    I have conducted an independent ordering and review of Labs.  I have conducted an independent ordering and review of EKG.  I have conducted an independent ordering and review of X-rays.    Considerations: Continued labs and possible chest CT        Chest pain shortness of breath with no current acute process will discharge home in stable condition with primary care follow-up           Amount and/or Complexity of Data Reviewed  Clinical lab tests: ordered and reviewed  Tests in the radiology section of CPT??: ordered and reviewed  Review and summarize past medical records: yes  Independent visualization of images, tracings, or specimens: yes    Risk of Complications, Morbidity, and/or Mortality  Presenting problems: moderate  Diagnostic procedures: moderate  Management options: moderate    Patient Progress  Patient progress: improved      Procedures    Labs Reviewed   COMPREHENSIVE METABOLIC PANEL - Abnormal; Notable for the following components:       Result Value    Calcium 8.1 (*)     AST 14 (*)     Albumin 3.4 (*)     All other components within normal limits   CBC WITH AUTO DIFFERENTIAL - Abnormal; Notable for the following components:    WBC 4.2 (*)     All other components within normal limits   MAGNESIUM   TROPONIN   BRAIN NATRIURETIC PEPTIDE        XR CHEST 1 VIEW   Final Result   No acute cardiopulmonary process.                Glasgow Coma Scale  Eye Opening: Spontaneous  Best Verbal Response: Oriented  Best Motor Response: Obeys commands  Glasgow Coma Scale Score: 15                     Voice dictation software was used during the making of this note.  This software is not perfect and grammatical and other typographical errors may be present.  This note has not been completely proofread for errors.     Andris Flurry, PA  08/27/21 1305       Andris Flurry, PA  08/27/21 1308

## 2021-08-27 NOTE — ED Notes (Signed)
I have reviewed discharge instructions with the patient.  The patient verbalized understanding.    Patient left ED via Discharge Method: ambulatory to Home with self.    Opportunity for questions and clarification provided.       Patient given 0 scripts.         To continue your aftercare when you leave the hospital, you may receive an automated call from our care team to check in on how you are doing.  This is a free service and part of our promise to provide the best care and service to meet your aftercare needs.??? If you have questions, or wish to unsubscribe from this service please call [phone].  Thank you for Choosing our [facility] Emergency Department.      Onnie Boer, RN  08/27/21 1330

## 2021-08-27 NOTE — Discharge Instructions (Signed)
We would love to help you get a primary care doctor for follow-up after your emergency department visit.    Please call [phone] between 7AM - 6PM Monday to Friday.  A care navigator will be able to assist you with setting up a doctor close to your home.          Call the above phone number to try to obtain a primary care doctor.  And then may get routine referral for cardiology.  In the meantime follow low-fat low-salt diet avoid alcohol or smoking.  Return to ER if symptoms worsen continue any at home medications.  Today you so no signs of heart failure or heart attack

## 2021-08-28 LAB — EKG 12-LEAD
Atrial Rate: 76 {beats}/min
Diagnosis: NORMAL
P Axis: 46 degrees
P-R Interval: 136 ms
Q-T Interval: 402 ms
QRS Duration: 78 ms
QTc Calculation (Bazett): 452 ms
R Axis: 50 degrees
T Axis: 35 degrees
Ventricular Rate: 76 {beats}/min
